# Patient Record
Sex: Female | Born: 1935 | Race: White | Hispanic: No | Smoking: Never smoker
Health system: Southern US, Community
[De-identification: ages and names within clinical notes are randomized; demographics above are authoritative.]

## PROBLEM LIST (undated history)

## (undated) DIAGNOSIS — I878 Other specified disorders of veins: Secondary | ICD-10-CM

## (undated) DIAGNOSIS — I5022 Chronic systolic (congestive) heart failure: Secondary | ICD-10-CM

## (undated) DIAGNOSIS — F0390 Unspecified dementia without behavioral disturbance: Secondary | ICD-10-CM

## (undated) DIAGNOSIS — I82409 Acute embolism and thrombosis of unspecified deep veins of unspecified lower extremity: Secondary | ICD-10-CM

## (undated) DIAGNOSIS — J449 Chronic obstructive pulmonary disease, unspecified: Secondary | ICD-10-CM

## (undated) DIAGNOSIS — I2510 Atherosclerotic heart disease of native coronary artery without angina pectoris: Secondary | ICD-10-CM

## (undated) DIAGNOSIS — C189 Malignant neoplasm of colon, unspecified: Secondary | ICD-10-CM

## (undated) HISTORY — PX: COLON SURGERY: SHX602

## (undated) HISTORY — PX: TONSILLECTOMY AND ADENOIDECTOMY: SUR1326

---

## 1997-11-23 ENCOUNTER — Encounter: Admission: RE | Admit: 1997-11-23 | Discharge: 1998-02-21 | Payer: Self-pay | Admitting: Radiation Oncology

## 2000-05-25 ENCOUNTER — Encounter (HOSPITAL_COMMUNITY): Admission: RE | Admit: 2000-05-25 | Discharge: 2000-06-24 | Payer: Self-pay | Admitting: Oncology

## 2000-05-25 ENCOUNTER — Encounter: Admission: RE | Admit: 2000-05-25 | Discharge: 2000-05-25 | Payer: Self-pay | Admitting: Oncology

## 2001-01-04 ENCOUNTER — Encounter: Admission: RE | Admit: 2001-01-04 | Discharge: 2001-01-04 | Payer: Self-pay | Admitting: Oncology

## 2001-01-04 ENCOUNTER — Encounter (HOSPITAL_COMMUNITY): Admission: RE | Admit: 2001-01-04 | Discharge: 2001-02-03 | Payer: Self-pay | Admitting: Oncology

## 2001-11-07 ENCOUNTER — Encounter: Admission: RE | Admit: 2001-11-07 | Discharge: 2001-11-07 | Payer: Self-pay | Admitting: Oncology

## 2001-11-07 ENCOUNTER — Encounter (HOSPITAL_COMMUNITY): Admission: RE | Admit: 2001-11-07 | Discharge: 2001-12-07 | Payer: Self-pay | Admitting: Oncology

## 2002-10-24 ENCOUNTER — Encounter (HOSPITAL_COMMUNITY): Admission: RE | Admit: 2002-10-24 | Discharge: 2002-11-09 | Payer: Self-pay | Admitting: Oncology

## 2002-10-24 ENCOUNTER — Encounter: Admission: RE | Admit: 2002-10-24 | Discharge: 2002-10-24 | Payer: Self-pay | Admitting: Oncology

## 2005-01-23 ENCOUNTER — Encounter (HOSPITAL_BASED_OUTPATIENT_CLINIC_OR_DEPARTMENT_OTHER): Admission: RE | Admit: 2005-01-23 | Discharge: 2005-04-23 | Payer: Self-pay | Admitting: Surgery

## 2009-09-25 ENCOUNTER — Ambulatory Visit: Payer: Self-pay | Admitting: Cardiology

## 2012-09-27 ENCOUNTER — Encounter (HOSPITAL_COMMUNITY)
Admission: EM | Disposition: A | Discharge status: Skilled Nursing Facility | Payer: Self-pay | Source: Other Acute Inpatient Hospital | Attending: Cardiology

## 2012-09-27 ENCOUNTER — Inpatient Hospital Stay (HOSPITAL_COMMUNITY)
Admission: EM | Admit: 2012-09-27 | Discharge: 2012-10-04 | DRG: 248 | Disposition: A | Discharge status: Skilled Nursing Facility | Payer: Medicare HMO | Source: Other Acute Inpatient Hospital | Attending: Cardiology | Admitting: Cardiology

## 2012-09-27 ENCOUNTER — Encounter (HOSPITAL_COMMUNITY): Payer: Self-pay | Admitting: Physician Assistant

## 2012-09-27 DIAGNOSIS — Z9981 Dependence on supplemental oxygen: Secondary | ICD-10-CM

## 2012-09-27 DIAGNOSIS — I872 Venous insufficiency (chronic) (peripheral): Secondary | ICD-10-CM | POA: Diagnosis present

## 2012-09-27 DIAGNOSIS — Z7901 Long term (current) use of anticoagulants: Secondary | ICD-10-CM

## 2012-09-27 DIAGNOSIS — F0390 Unspecified dementia without behavioral disturbance: Secondary | ICD-10-CM | POA: Diagnosis present

## 2012-09-27 DIAGNOSIS — Z86718 Personal history of other venous thrombosis and embolism: Secondary | ICD-10-CM

## 2012-09-27 DIAGNOSIS — Z66 Do not resuscitate: Secondary | ICD-10-CM | POA: Diagnosis present

## 2012-09-27 DIAGNOSIS — I509 Heart failure, unspecified: Secondary | ICD-10-CM

## 2012-09-27 DIAGNOSIS — I129 Hypertensive chronic kidney disease with stage 1 through stage 4 chronic kidney disease, or unspecified chronic kidney disease: Secondary | ICD-10-CM | POA: Diagnosis present

## 2012-09-27 DIAGNOSIS — L97919 Non-pressure chronic ulcer of unspecified part of right lower leg with unspecified severity: Secondary | ICD-10-CM

## 2012-09-27 DIAGNOSIS — L97909 Non-pressure chronic ulcer of unspecified part of unspecified lower leg with unspecified severity: Secondary | ICD-10-CM | POA: Diagnosis present

## 2012-09-27 DIAGNOSIS — I255 Ischemic cardiomyopathy: Secondary | ICD-10-CM

## 2012-09-27 DIAGNOSIS — I5021 Acute systolic (congestive) heart failure: Secondary | ICD-10-CM | POA: Diagnosis present

## 2012-09-27 DIAGNOSIS — R0602 Shortness of breath: Secondary | ICD-10-CM

## 2012-09-27 DIAGNOSIS — J4489 Other specified chronic obstructive pulmonary disease: Secondary | ICD-10-CM | POA: Diagnosis present

## 2012-09-27 DIAGNOSIS — I2109 ST elevation (STEMI) myocardial infarction involving other coronary artery of anterior wall: Principal | ICD-10-CM | POA: Diagnosis present

## 2012-09-27 DIAGNOSIS — I2582 Chronic total occlusion of coronary artery: Secondary | ICD-10-CM | POA: Diagnosis present

## 2012-09-27 DIAGNOSIS — I2589 Other forms of chronic ischemic heart disease: Secondary | ICD-10-CM | POA: Diagnosis present

## 2012-09-27 DIAGNOSIS — J449 Chronic obstructive pulmonary disease, unspecified: Secondary | ICD-10-CM | POA: Diagnosis present

## 2012-09-27 DIAGNOSIS — N183 Chronic kidney disease, stage 3 unspecified: Secondary | ICD-10-CM | POA: Diagnosis present

## 2012-09-27 DIAGNOSIS — I2510 Atherosclerotic heart disease of native coronary artery without angina pectoris: Secondary | ICD-10-CM

## 2012-09-27 DIAGNOSIS — Z85038 Personal history of other malignant neoplasm of large intestine: Secondary | ICD-10-CM

## 2012-09-27 HISTORY — DX: Malignant neoplasm of colon, unspecified: C18.9

## 2012-09-27 HISTORY — DX: Acute embolism and thrombosis of unspecified deep veins of unspecified lower extremity: I82.409

## 2012-09-27 HISTORY — DX: Chronic obstructive pulmonary disease, unspecified: J44.9

## 2012-09-27 HISTORY — DX: Chronic systolic (congestive) heart failure: I50.22

## 2012-09-27 HISTORY — PX: PERCUTANEOUS CORONARY STENT INTERVENTION (PCI-S): SHX5485

## 2012-09-27 HISTORY — DX: Unspecified dementia, unspecified severity, without behavioral disturbance, psychotic disturbance, mood disturbance, and anxiety: F03.90

## 2012-09-27 HISTORY — PX: LEFT HEART CATHETERIZATION WITH CORONARY ANGIOGRAM: SHX5451

## 2012-09-27 HISTORY — DX: Other specified disorders of veins: I87.8

## 2012-09-27 HISTORY — DX: Atherosclerotic heart disease of native coronary artery without angina pectoris: I25.10

## 2012-09-27 LAB — CBC
MCH: 30.4 pg (ref 26.0–34.0)
MCHC: 33.2 g/dL (ref 30.0–36.0)
Platelets: 165 10*3/uL (ref 150–400)
RDW: 13.7 % (ref 11.5–15.5)

## 2012-09-27 LAB — CREATININE, SERUM: Creatinine, Ser: 0.95 mg/dL (ref 0.50–1.10)

## 2012-09-27 SURGERY — LEFT HEART CATHETERIZATION WITH CORONARY ANGIOGRAM
Anesthesia: LOCAL

## 2012-09-27 MED ORDER — ACETAMINOPHEN 325 MG PO TABS
650.0000 mg | ORAL_TABLET | ORAL | Status: DC | PRN
  Administered 2012-09-27: 650 mg via ORAL
  Filled 2012-09-27: qty 2

## 2012-09-27 MED ORDER — ONDANSETRON HCL 4 MG/2ML IJ SOLN: 4.0000 mg | Freq: Four times a day (QID) | INTRAMUSCULAR | Status: DC | PRN

## 2012-09-27 MED ORDER — ISOSORBIDE MONONITRATE ER 30 MG PO TB24
30.0000 mg | ORAL_TABLET | Freq: Every day | ORAL | Status: DC
  Administered 2012-09-27: 30 mg via ORAL
  Filled 2012-09-27 (×2): qty 1

## 2012-09-27 MED ORDER — ASPIRIN 81 MG PO CHEW
81.0000 mg | CHEWABLE_TABLET | Freq: Every day | ORAL | Status: DC
  Administered 2012-09-28 – 2012-10-04 (×7): 81 mg via ORAL
  Filled 2012-09-27 (×6): qty 1

## 2012-09-27 MED ORDER — HEPARIN (PORCINE) IN NACL 2-0.9 UNIT/ML-% IJ SOLN
INTRAMUSCULAR | Status: AC
  Filled 2012-09-27: qty 1000

## 2012-09-27 MED ORDER — LIDOCAINE HCL (PF) 1 % IJ SOLN
INTRAMUSCULAR | Status: AC
  Filled 2012-09-27: qty 30

## 2012-09-27 MED ORDER — ATORVASTATIN CALCIUM 10 MG PO TABS
10.0000 mg | ORAL_TABLET | Freq: Every day | ORAL | Status: DC
  Administered 2012-09-27 – 2012-10-03 (×7): 10 mg via ORAL
  Filled 2012-09-27 (×8): qty 1

## 2012-09-27 MED ORDER — METOPROLOL TARTRATE 25 MG PO TABS
25.0000 mg | ORAL_TABLET | Freq: Two times a day (BID) | ORAL | Status: DC
  Administered 2012-09-27: 25 mg via ORAL
  Filled 2012-09-27 (×3): qty 1

## 2012-09-27 MED ORDER — VERAPAMIL HCL 2.5 MG/ML IV SOLN
INTRAVENOUS | Status: AC
  Filled 2012-09-27: qty 2

## 2012-09-27 MED ORDER — NITROGLYCERIN 0.2 MG/ML ON CALL CATH LAB
INTRAVENOUS | Status: AC
  Filled 2012-09-27: qty 1

## 2012-09-27 MED ORDER — HYDRALAZINE HCL 20 MG/ML IJ SOLN
10.0000 mg | Freq: Once | INTRAMUSCULAR | Status: AC
  Administered 2012-09-27: 10 mg via INTRAVENOUS
  Filled 2012-09-27: qty 1

## 2012-09-27 MED ORDER — CLOPIDOGREL BISULFATE 300 MG PO TABS
ORAL_TABLET | ORAL | Status: AC
  Filled 2012-09-27: qty 2

## 2012-09-27 MED ORDER — SODIUM CHLORIDE 0.9 % IV SOLN
INTRAVENOUS | Status: AC
  Administered 2012-09-27: 19:00:00 via INTRAVENOUS

## 2012-09-27 MED ORDER — ALBUTEROL SULFATE (5 MG/ML) 0.5% IN NEBU
2.5000 mg | INHALATION_SOLUTION | Freq: Four times a day (QID) | RESPIRATORY_TRACT | Status: DC | PRN
  Administered 2012-09-30 – 2012-10-03 (×3): 2.5 mg via RESPIRATORY_TRACT
  Filled 2012-09-27 (×3): qty 0.5

## 2012-09-27 MED ORDER — CLOPIDOGREL BISULFATE 75 MG PO TABS
75.0000 mg | ORAL_TABLET | Freq: Every day | ORAL | Status: DC
  Administered 2012-09-28 – 2012-10-04 (×7): 75 mg via ORAL
  Filled 2012-09-27 (×6): qty 1

## 2012-09-27 MED ORDER — FENTANYL CITRATE 0.05 MG/ML IJ SOLN
INTRAMUSCULAR | Status: AC
  Filled 2012-09-27: qty 2

## 2012-09-27 MED ORDER — ACETAMINOPHEN 325 MG PO TABS: 650.0000 mg | ORAL_TABLET | ORAL | Status: DC | PRN

## 2012-09-27 MED ORDER — HEPARIN SODIUM (PORCINE) 5000 UNIT/ML IJ SOLN
5000.0000 [IU] | Freq: Three times a day (TID) | INTRAMUSCULAR | Status: DC
  Filled 2012-09-27 (×4): qty 1

## 2012-09-27 MED ORDER — HEPARIN SODIUM (PORCINE) 1000 UNIT/ML IJ SOLN
INTRAMUSCULAR | Status: AC
  Filled 2012-09-27: qty 1

## 2012-09-27 NOTE — CV Procedure (Signed)
Cardiac Catheterization Procedure Note  Name: Kara Rice MRN: [id number] DOB: 1918-01-19  Procedure: Left heart catheterization, Selective Coronary Angiography,  PTCA and stenting of the first diagonal. Attempted PTCA of the LAD.  Indication: 79 year old white female with history of congestive heart failure, prior PE, COPD, presented to [facility] in [place] with complaints of dyspnea. No chest pain. ECG showed ST elevation 2 mm in the anterolateral leads that was new compared to April 2014. She was transferred from an outside hospital for emergent cardiac catheterization. She has been on chronic Coumadin therapy and INR is therapeutic.  Procedural Details:  The right wrist was prepped, draped, and anesthetized with 1% lidocaine. Using the modified Seldinger technique, a 6 French sheath was introduced into the right radial artery. 3 mg of verapamil was administered through the sheath, weight-based unfractionated heparin was administered intravenously. Standard Judkins catheters were used for selective coronary angiography. Catheter exchanges were performed over an exchange length guidewire.  PROCEDURAL FINDINGS Hemodynamics: AO 170/92 with a mean of 125 mmHg LV 173/22 mmHg   Coronary angiography: Coronary dominance: right  Left mainstem: Normal.  Left anterior descending (LAD): There is a 90-95% stenosis in the proximal LAD extending into a very large first diagonal branch. The continuation of the LAD after the diagonal is occluded. There were very mild left to left collaterals to the LAD. There were right to left collaterals to the septal perforators.  Left circumflex (LCx): The left circumflex gives rise to 3 marginal branches. There is mild nonobstructive disease in the mid vessel up to 20%. The distal marginal branch has segmental disease up to 30%.  Right coronary artery (RCA): The right coronary has an anterior takeoff. It was engaged with a left Amplatz catheter. It is a  dominant vessel and is normal.  Left ventriculography: Not performed  PCI Note:  Following the diagnostic procedure, the decision was made to proceed with PCI of the LAD/diagonal.  Weight-based heparin was given for anticoagulation. Plavix 600 mg was given orally. Once a therapeutic ACT was achieved, a 6 [place] XB LAD 3.5 guide catheter was inserted.  A miracle brothers 3 coronary guidewire was used to cross the lesion in the diagonal. The LAD had an acute takeoff from the diagonal and only a very small channel was seen. Extensive attempts to access the LAD with a wire were unsuccessful. At one point we were able to advance the wire tip into the very proximal portion of the LAD but we were unable to advance it further and the wire position was very tenuous. After attempting to access this for 45-60 minutes we decided to proceed with stenting of the large first diagonal branch. The lesion was predilated with a 2.5 mm balloon.  The lesion was then stented with a 3.0 x 24 mm Rebel stent.   Following PCI, there was 0% residual stenosis and TIMI-3 flow in the diagonal. The LAD remained occluded. Final angiography confirmed an excellent result. The patient tolerated the procedure well. There were no immediate procedural complications. A TR band was used for radial hemostasis. The patient was transferred to the post catheterization recovery area for further monitoring.  PCI Data: Vessel - first diagonal/Segment - proximal Percent Stenosis (pre)  90-95% TIMI-flow 3 Stent 3.0 x 24 mm rebel Percent Stenosis (post) 0% TIMI-flow (post) 3  Vessel are 2 mid LAD-unsuccessful PCI due to inability to cross with a wire.  Final Conclusions:   1. Single vessel occlusive coronary disease. 2. Successful stenting of the  first diagonal with a bare-metal stent. Unsuccessful PCI of the LAD.   Recommendations:  Dual antiplatelet therapy with aspirin and Plavix for at least one month and preferably for one year. We will  assess LV function with an echocardiogram.  Peter [place] MD,FACC  09/27/2012, 5:23 PM

## 2012-09-27 NOTE — Progress Notes (Signed)
PHARMACIST - PHYSICIAN COMMUNICATION DR:  [place] CONCERNING: Pharmacy Care Issues Regarding Warfarin Labs  Per home med info from daughter, Maudry Zeidan, patient was taking Coumadin 5 mg alternating with 4 mg every other day.  Hx PE/DVT about 3 years ago. Last Coumadin dose was 4 mg on 09/26/12.  INR today at [facility] was 2.3. Coumadin is now discontinued post-PCI. To continue Aspirin 81 mg and Plavix 75 mg daily. Heparin 5000 units SQ q8hr to begin on 8/20 for VTE prophylaxis.  Plan:  - added PT/INR for am.  - hold SQ heparin while INR >2.  Nicolette Bang, RPh Pager: [phone] 09/27/2012 11:28 PM

## 2012-09-27 NOTE — H&P (Signed)
History and Physical  Patient ID: Kara Rice MRN: [id number], DOB: [birth year] Date of Encounter: 09/27/2012, 3:56 PM Primary Physician: No primary provider on file. Primary Cardiologist: New to [facility], unclear if she has been followed previously elsewhere  Chief Complaint: SOB Reason for Admission: STEMI  HPI: Kara Rice is a [name] with history (obtained solely from her daughter) of dementia, DVT (on chronic Coumadin), venous stasis with leg ulcers, COPD on home O2, remote colon cancer, CHF who presented to [facility] today upon emergent transfer from [facility]. The daughter took the patient to her wound care appointment today and she apparently became very SOB and wheezy. EMS was called but the patient refused for them to touch her and refused ambulance ride at that time. EMS urged the patient to go to the ER, so she agreed to go via her family's car. In the ER, venous duplex neg for DVT. It appears that initial EKG showed NSR with nonspecific ST changes. Troponin returned positive at 1.89. F/u EKG showed anterolateral ST elevation thus code STEMI was called. The patient received 324mg  ASA, initiation of heparin, and was transferred emergently to the cath lab at [facility]. Per her daughter, the patient has not complained of CP whatsoever. The patient was unable to provide any relevant history due to her dementia. She knew she was having a heart attack but did not recall the details leading up to this. Daughter's # Kara Rice [phone]. The pt lives with her daughter who assists with all ADLs. Her son is on his way to the hospital now.  Past Medical History  Diagnosis Date  . Dementia   . DVT (deep venous thrombosis)     on Coumadin  . Venous stasis     Hx leg ulcers  . COPD (chronic obstructive pulmonary disease)     On home oxygen  . Colon cancer     Age 31 s/p surgery.  . CHF (congestive heart failure)     Per daughter's report    Dtr declines hx of  TIA/CVA, DM, HTN, HL.  Most Recent Cardiac Studies: None   Surgical History: No past surgical history on file.   Home Meds: Do not currently have access to this. Pt doesn't know. Will need to have pharmacy reconcile meds. [place] list states: Coumadin, Imdur, ventolin nebs, Vicodin  Allergies:  Allergies  Allergen Reactions  . Codeine     Hx of nausea with pain meds with this    History   Social History  . Marital Status: Widowed    Spouse Name: N/A    Number of Children: N/A  . Years of Education: N/A   Occupational History  . Not on file.   Social History Main Topics  . Smoking status: Never Smoker   . Smokeless tobacco: Not on file  . Alcohol Use: No  . Drug Use: No  . Sexual Activity: Not on file   Other Topics Concern  . Not on file   Social History Narrative  . No narrative on file    Family Hx: unable to obtain as patient is demented  Review of Systems: unable to obtain as patient is demented  Labs: Na 140, K 4.2, cl 107, CO2 28, glu 96, BUN 18, Cr 1.2, ca 9.1, tbili 0.3, ast 22, alt 12, AP 66, total prot 7.2, albumin 3.8 WBC 4.9, Hgb 12, Hc 35.4, Plt 196 pBNP 758 INR 2.2  CK 111 MB 4.4 Troponin 1.89  Radiology/Studies:  8/19 RLE Venous duplex - no evidence of R leg DVT. 8/19 CXR - minimal L effusio or pleural scarring, otherwise no acute abnormality, cardiac enlargement   EKG: NSR 70bpm inferior infarct old, probable acute anterolateral infarct with ST elevation V3-V5  Physical Exam: HR 90, BP 173/99, RR 14, POx 99% o2 General: Well developed elderly WF in no acute distress. Head: Normocephalic, atraumatic, sclera non-icteric, no xanthomas, nares are without discharge.  Neck: Supple Lungs: Clear bilaterally to auscultation anteriorly without wheezes, rales, or rhonchi. Breathing is unlabored. Heart: RRR with S1 S2. No murmurs, rubs, or gallops appreciated (quick assessment given acuity of situation) Abdomen: Soft, non-tender, non-distended  with normoactive bowel sounds. No hepatomegaly. No rebound/guarding. No obvious abdominal masses. Msk:  Strength and tone appear normal for age. Extremities: No clubbing or cyanosis. Tr pedal edema, R leg wrapped. Mild bilateral shin erythema bilaterally.  Distal pedal pulses are 2+ and equal bilaterally. Neuro: Alert and oriented to place, self. No focal deficit. Follows commands. Psych:  Normal affect.    ASSESSMENT AND PLAN:  1. Acute STEMI/CAD 2. HTN 3. Prior history of DVT, on chronic Coumadin 4. CHF, type unknown 5. Mild acute renal insufficiency, baseline Cr unknown 6. COPD, on home O2 7. Venous stasis with history of ulceration 8. Dementia 9. Remote history of colon cancer  Will admit to CCU, cycle enzymes, check lipids. Ask pharmacy to clarify home med list. PRN nebs. Add daily ASA and statin. Check 2D echo. Wound care consult for wrapped R leg wound. Follow renal function. MD to decide regarding continuation of Coumadin and choice of antiplatelet agent.  Signed, Ronie Spies PA-C 09/27/2012, 3:56 PM  Patient seen and examined and history reviewed. Agree with above findings and plan. Little history available on initial evaluation. Patient presented with symptoms of dyspnea without chest pain. Ecg subsequently demonstrated ST elevation 2 mm in the anterolateral leads new since 4/14. Emergent cardiac cath recommended.   Theron Arista [facility] 09/27/2012 5:57 PM

## 2012-09-27 NOTE — Progress Notes (Signed)
Called daughter to give update at her request. Also clarified - pt has only one episode of PE/DVT about 3 yrs ago. No recent recurrence. Dayna Dunn PA-C

## 2012-09-27 NOTE — Progress Notes (Signed)
CRITICAL VALUE ALERT  Critical value received:  Torponin 1.26  Date of notification:  09/27/12  Time of notification:  2126  Critical value read back:yes  Nurse who received alert:  Exie Parody RN  MD notified (1st page): Dr. Shirlee Latch [facility])  Time of first page:  2130  Responding MD: Dr. Shirlee Latch  Time MD responded:  2133

## 2012-09-28 DIAGNOSIS — J449 Chronic obstructive pulmonary disease, unspecified: Secondary | ICD-10-CM | POA: Diagnosis present

## 2012-09-28 DIAGNOSIS — L97909 Non-pressure chronic ulcer of unspecified part of unspecified lower leg with unspecified severity: Secondary | ICD-10-CM | POA: Diagnosis present

## 2012-09-28 DIAGNOSIS — I369 Nonrheumatic tricuspid valve disorder, unspecified: Secondary | ICD-10-CM

## 2012-09-28 LAB — LIPID PANEL
LDL Cholesterol: 84 mg/dL (ref 0–99)
Triglycerides: 78 mg/dL (ref <150)
VLDL: 16 mg/dL (ref 0–40)

## 2012-09-28 LAB — CBC
HCT: 27.3 % — ABNORMAL LOW (ref 36.0–46.0)
MCHC: 33.7 g/dL (ref 30.0–36.0)
MCV: 90.7 fL (ref 78.0–100.0)
MCV: 90.4 fL (ref 78.0–100.0)
Platelets: 144 10*3/uL — ABNORMAL LOW (ref 150–400)
Platelets: 154 10*3/uL (ref 150–400)
RDW: 13.8 % (ref 11.5–15.5)
RDW: 13.8 % (ref 11.5–15.5)
WBC: 5 10*3/uL (ref 4.0–10.5)

## 2012-09-28 LAB — PROTIME-INR: INR: 2.43 — ABNORMAL HIGH (ref 0.00–1.49)

## 2012-09-28 LAB — PRO B NATRIURETIC PEPTIDE: Pro B Natriuretic peptide (BNP): 6323 pg/mL — ABNORMAL HIGH (ref 0–450)

## 2012-09-28 LAB — COMPREHENSIVE METABOLIC PANEL
Alkaline Phosphatase: 52 U/L (ref 39–117)
BUN: 16 mg/dL (ref 6–23)
CO2: 26 mEq/L (ref 19–32)
Chloride: 106 mEq/L (ref 96–112)
GFR calc Af Amer: 52 mL/min — ABNORMAL LOW (ref >90)
GFR calc non Af Amer: 45 mL/min — ABNORMAL LOW (ref >90)
Glucose, Bld: 92 mg/dL (ref 70–99)
Potassium: 4.1 mEq/L (ref 3.5–5.1)
Total Bilirubin: 0.4 mg/dL (ref 0.3–1.2)

## 2012-09-28 MED ORDER — LISINOPRIL 5 MG PO TABS
5.0000 mg | ORAL_TABLET | Freq: Every day | ORAL | Status: DC
  Administered 2012-09-29 – 2012-10-04 (×6): 5 mg via ORAL
  Filled 2012-09-28 (×7): qty 1

## 2012-09-28 MED ORDER — CARVEDILOL 6.25 MG PO TABS
6.2500 mg | ORAL_TABLET | Freq: Two times a day (BID) | ORAL | Status: DC
  Administered 2012-09-28 – 2012-10-04 (×13): 6.25 mg via ORAL
  Filled 2012-09-28 (×15): qty 1

## 2012-09-28 MED ORDER — HEPARIN SODIUM (PORCINE) 5000 UNIT/ML IJ SOLN
5000.0000 [IU] | Freq: Three times a day (TID) | INTRAMUSCULAR | Status: DC
  Administered 2012-09-29 – 2012-10-03 (×14): 5000 [IU] via SUBCUTANEOUS
  Filled 2012-09-28 (×20): qty 1

## 2012-09-28 NOTE — Progress Notes (Signed)
  Echocardiogram 2D Echocardiogram has been performed.  Kara Rice 09/28/2012, 12:13 PM

## 2012-09-28 NOTE — Progress Notes (Signed)
Pt received 10mg  of hydralazine @ 2021. At 2231 pt received metoprolol 25mg  BP was 114/55. Since receiving medication pts BP has dropped to SBP between 70-80 and HR 45-60. Pt is sleeping. Assessed pt and shows no signs of mental status changes. Will continue to assess and monitor.

## 2012-09-28 NOTE — Care Management Note (Signed)
    Page 1 of 1   09/28/2012     7:58:21 AM   CARE MANAGEMENT NOTE 09/28/2012  Patient:  Kara Rice, Kara Rice   Account Number:  [id number]  Date Initiated:  09/28/2012  Documentation initiated by:  Junius Creamer  Subjective/Objective Assessment:   adm w mi     Action/Plan:   lives w da   Anticipated DC Date:     Anticipated DC Plan:        DC Planning Services  CM consult      Choice offered to / List presented to:             Status of service:   Medicare Important Message given?   (If response is "NO", the following Medicare IM given date fields will be blank) Date Medicare IM given:   Date Additional Medicare IM given:    Discharge Disposition:    Per UR Regulation:  Reviewed for med. necessity/level of care/duration of stay  If discussed at Long Length of Stay Meetings, dates discussed:    Comments:

## 2012-09-28 NOTE — Progress Notes (Signed)
CARDIAC REHAB PHASE I   PRE:  Rate/Rhythm: 66SR  BP:  Supine:  Sitting: 122/50  Standing:    SaO2: 99%RA  MODE:  Ambulation: 220 ft   POST:  Rate/Rhythm: 99SR  BP:  Supine:   Sitting: 129/71  Standing:    SaO2: 97%RA 1325-1355 Pt walked 220 ft with gait belt use, rolling walker and asst x 2 with fairly steady gait. Denied CP. Wheezing some during walk. Pt stated that sometimes her left leg gives out and she just sits down at home. Uses one crutch on that side to walk at home. Pt asks same questions over again as if she does not remember talking about them already. To recliner with family present.  Luetta Nutting, RN BSN  09/28/2012 1:50 PM

## 2012-09-28 NOTE — Progress Notes (Addendum)
TELEMETRY: Reviewed telemetry pt in NSR: Filed Vitals:   09/28/12 0400 09/28/12 0447 09/28/12 0500 09/28/12 0600  BP: 103/54  117/104 92/40  Pulse: 60   93  Temp: 97.7 F (36.5 C)     TempSrc: Oral     Resp: 22  16 21   Height:      Weight:  149 lb 0.5 oz (67.6 kg)    SpO2: 100%  100% 100%    Intake/Output Summary (Last 24 hours) at 09/28/12 0707 Last data filed at 09/28/12 0500  Gross per 24 hour  Intake    675 ml  Output   1600 ml  Net   -925 ml    SUBJECTIVE Feels well. No SOB or chest pain. Comfortable.   LABS: Basic Metabolic Panel:  Recent Labs  54/09/81 2009 09/28/12 0220  NA  --  139  K  --  4.1  CL  --  106  CO2  --  26  GLUCOSE  --  92  BUN  --  16  CREATININE 0.95 1.05  CALCIUM  --  8.3*   Liver Function Tests:  Recent Labs  09/28/12 0220  AST 20  ALT 8  ALKPHOS 52  BILITOT 0.4  PROT 5.3*  ALBUMIN 2.6*   CBC:  Recent Labs  09/27/12 2009 09/28/12 0220  WBC 5.0 4.2  HGB 10.9* 9.2*  HCT 32.8* 27.3*  MCV 91.6 90.7  PLT 165 144*   Cardiac Enzymes:  Recent Labs  09/27/12 2009 09/28/12 0220  TROPONINI 1.26* 2.69*   Fasting Lipid Panel:  Recent Labs  09/28/12 0220  CHOL 134  HDL 34*  LDLCALC 84  TRIG 78  CHOLHDL 3.9   Thyroid Function Tests:  Recent Labs  09/27/12 2009  TSH 2.606     Radiology/Studies:  CXR at [facility]-minimal left effusion/scarring. Cardiac enlargement.  Ecg: NSR, LAD, anteroseptal infarct.   PHYSICAL EXAM General: very pleasant, well nourished, in no acute distress. Head: Normal Neck: Negative for carotid bruits. JVD not elevated. Lungs: Few bilateral wheezes. Breathing is unlabored. Heart: RRR S1 S2 without murmurs, rubs, or gallops.  Abdomen: Soft, non-tender, non-distended with normoactive bowel sounds. No hepatomegaly. No rebound/guarding. No obvious abdominal masses. Msk:  Strength and tone appears normal for age. Extremities: No clubbing, cyanosis or edema.  Distal pedal  pulses are 2+ and equal bilaterally. No radial hematoma. Neuro: Alert and oriented X 3. Moves all extremities spontaneously. No focal findings. Psych:  Responds to questions appropriately with a normal affect.  ASSESSMENT AND PLAN: 1. Anterior STEMI. S/p BMS of large first diagonal. Unsuccessful attempt to open LAD. Based on fairly small enzyme rise I suspect LAD occlusion may be chronic and current event was related to diagonal occlusion. Will continue ASA and Plavix. Continue beta blocker and statin. Hold nitrates given low BP today. Will check echo. 2. History of DVT/PE 3 years ago. On coumadin previously. Will hold coumadin now that she is on ASA and plavix. LE venous doppler yesterday was negative for DVT. Only indication for resuming coumadin would be if she has LV thrombus by Echo.  3. COPD. 4. HTN will treat with coreg and ACEi. 5. Advance directive. Patient states she does not have a living will. She states that if she stops breathing or if her heart stops she desires [place] to "let her go". Doesn't want aggressive end of life care at her age. Will make her a DNR. 6. Leg ulcer. Wound care consult.  Active Problems:  ST elevation myocardial infarction (STEMI) of anterolateral wall   CHF (congestive heart failure)   CKD (chronic kidney disease) stage 3, GFR 30-59 ml/min    Signed, Walida Cajas [place] MD,FACC 09/28/2012 7:17 AM

## 2012-09-28 NOTE — Consult Note (Signed)
WOC consult Note Reason for Consult: evaluation of right LE wounds. Bedside nursing reports pt living with daughter and has been treated at the [facility] wound care center.  Pt does tell me that she has LE edema and sometimes the really are swollen. She reports a few weeks ago two blisters appeared on the RLE "about the size of a quarter".  She is not able to tell me any treatment received at the wound care center, except that she has seen two different doctors there. No family in the room currently. Wound type: ruptured blisters RLE lateral secondary to edema, however she does not have significant edema today at the time of my assessment. Measurement: two areas right calf laterally, 2.0cm x 1.5cmx 0.2cm  distal and 1.5cm x 1.0cm x 0.2cm proximal  Wound bed: both are clean, pink and moist, with evidence of bulla rupture at the wound edges. Drainage (amount, consistency, odor) minimal serosanguinous  Periwound: intact, some mild erythema at the distal site. Noted some ecchymosis just below the patella, unclear on the etiology. Dressing procedure/placement/frequency: xeroform gauze ordered for topical tx. For antibacterial properties and to manage minimal exudate.  Wrap with kerlix and change daily.  Follow up as needed in the wound care center, would suggest light compression knee highs (10-43mmHG) to manage edema and prevent further skin breakdown.    Discussed POC with patient and bedside nurse.  Re consult if needed, will not follow at this time. Thanks  Bethzy Hauck [employer], CWOCN [phone])

## 2012-09-29 DIAGNOSIS — J449 Chronic obstructive pulmonary disease, unspecified: Secondary | ICD-10-CM

## 2012-09-29 LAB — BASIC METABOLIC PANEL
Chloride: 106 mEq/L (ref 96–112)
Creatinine, Ser: 1.32 mg/dL — ABNORMAL HIGH (ref 0.50–1.10)
GFR calc Af Amer: 39 mL/min — ABNORMAL LOW (ref >90)

## 2012-09-29 MED ORDER — ALUM & MAG HYDROXIDE-SIMETH 200-200-20 MG/5ML PO SUSP
30.0000 mL | Freq: Four times a day (QID) | ORAL | Status: DC | PRN
  Administered 2012-09-29: 30 mL via ORAL
  Filled 2012-09-29: qty 30

## 2012-09-29 NOTE — Progress Notes (Signed)
TELEMETRY: Reviewed telemetry pt in NSR: Filed Vitals:   09/29/12 0400 09/29/12 0500 09/29/12 0600 09/29/12 0700  BP: 98/49  108/44 139/32  Pulse:      Temp: 97.7 F (36.5 C)     TempSrc: Oral     Resp: 26  32 30  Height:      Weight:  154 lb 12.2 oz (70.2 kg)    SpO2: 95%       Intake/Output Summary (Last 24 hours) at 09/29/12 0729 Last data filed at 09/29/12 0500  Gross per 24 hour  Intake    325 ml  Output    950 ml  Net   -625 ml    SUBJECTIVE Feels well. No SOB or chest pain. Comfortable.   LABS: Basic Metabolic Panel:  Recent Labs  16/10/96 0220 09/29/12 0500  NA 139 138  K 4.1 4.7  CL 106 106  CO2 26 25  GLUCOSE 92 102*  BUN 16 20  CREATININE 1.05 1.32*  CALCIUM 8.3* 9.2   Liver Function Tests:  Recent Labs  09/28/12 0220  AST 20  ALT 8  ALKPHOS 52  BILITOT 0.4  PROT 5.3*  ALBUMIN 2.6*   CBC:  Recent Labs  09/28/12 0220 09/28/12 0730  WBC 4.2 5.0  HGB 9.2* 9.5*  HCT 27.3* 28.2*  MCV 90.7 90.4  PLT 144* 154   Cardiac Enzymes:  Recent Labs  09/27/12 2009 09/28/12 0220 09/28/12 0730  TROPONINI 1.26* 2.69* 2.45*   Fasting Lipid Panel:  Recent Labs  09/28/12 0220  CHOL 134  HDL 34*  LDLCALC 84  TRIG 78  CHOLHDL 3.9   Thyroid Function Tests:  Recent Labs  09/27/12 2009  TSH 2.606     Radiology/Studies:  CXR at [facility] hospital-minimal left effusion/scarring. Cardiac enlargement.  Ecg: NSR, LAD, anteroseptal infarct.   Echo:Study Conclusions  - Left ventricle: Septal apical mid and distal anterior wall hypokinesis as well as apical inferior wall hypokinesos The cavity size was moderately dilated. Wall thickness was normal. The estimated ejection fraction was 30%. - Atrial septum: No defect or patent foramen ovale was identified.   PHYSICAL EXAM General: very pleasant, well nourished, in no acute distress. Head: Normal Neck: Negative for carotid bruits. JVD not elevated. Lungs: Clear. Breathing is  unlabored. Heart: RRR S1 S2 without murmurs, rubs, or gallops.  Abdomen: Soft, non-tender, non-distended with normoactive bowel sounds. No hepatomegaly. No rebound/guarding. No obvious abdominal masses. Msk:  Strength and tone appears normal for age. Extremities: No clubbing, cyanosis or edema.  Distal pedal pulses are 2+ and equal bilaterally. No radial hematoma. Neuro: Alert and oriented X 3. Moves all extremities spontaneously. No focal findings. Psych:  Responds to questions appropriately with a normal affect.  ASSESSMENT AND PLAN: 1. Anterior STEMI. S/p BMS of large first diagonal. Unsuccessful attempt to open LAD. Based on fairly small enzyme rise I suspect LAD occlusion was chronic and current event was related to diagonal occlusion. Will continue ASA and Plavix. Continue beta blocker and statin.  2. History of DVT/PE 3 years ago. On coumadin previously. Will hold coumadin now that she is on ASA and plavix. LE venous doppler yesterday was negative for DVT.  3. Ischemic cardiomyopathy with EF 30%. On ACEi and carvedilol. Will treat with coreg and ACEi. BP soft. Plan to start aldactone tomorrow if BP improved and renal function ok. 4. HTN 5. DNR 6. Leg ulcer. Wound care consult. 7. COPD  Will transfer to telemetry today.  Active Problems:  ST elevation myocardial infarction (STEMI) of anterolateral wall   CHF (congestive heart failure)   CKD (chronic kidney disease) stage 3, GFR 30-59 ml/min   COPD (chronic obstructive pulmonary disease)   Leg ulcer    Signed, Evangelia Whitaker [place] MD,FACC 09/29/2012 7:29 AM

## 2012-09-29 NOTE — Evaluation (Signed)
Physical Therapy Evaluation Patient Details Name: Kara Rice MRN: [id number] DOB: [birth year] Today's Date: 09/29/2012 Time: 5284-1324 PT Time Calculation (min): 17 min  PT Assessment / Plan / Recommendation History of Present Illness  [name] with history of dementia, DVT (on chronic Coumadin), venous stasis with leg ulcers, COPD on home O2, remote colon cancer, CHF who presented to [facility] today upon emergent transfer from [facility]. The daughter took the patient to her wound care appointment today and she apparently became very SOB and wheezy. EMS was called but the patient refused for them to touch her and refused ambulance ride at that time. EMS urged the patient to go to the ER, so she agreed to go via her family's car. In the ER, venous duplex neg for DVT. It appears that initial EKG showed NSR with nonspecific ST changes. Troponin returned positive at 1.89. F/u EKG showed anterolateral ST elevation thus code STEMI was called. The patient received 324mg  ASA, initiation of heparin, and was transferred emergently to the cath lab at [facility]. Per her daughter, the patient has not complained of CP whatsoever. The patient was unable to provide any relevant history due to her dementia. She knew she was having a heart attack but did not recall the details leading up to this. Daughter's # Donata Clay [phone].  Admitted for STEMI, s/p Left heart catheterization, Selective Coronary Angiography,  PTCA and stenting of the first diagonal. Attempted PTCA of the LAD.  Clinical Impression  Pt admitted with above. Pt currently with functional limitations due to the deficits listed below (see PT Problem List).  Pt will benefit from skilled PT to increase their independence and safety with mobility to allow discharge to the venue listed below.  Pt with memory deficits stating she lives alone and uses crutch for mobility.  Per RN, daughter can no longer provide required care.        PT Assessment  Patient needs continued PT services    Follow Up Recommendations  Supervision/Assistance - 24 hour;SNF    Does the patient have the potential to tolerate intense rehabilitation      Barriers to Discharge        Equipment Recommendations  None recommended by PT (pt states she has RW (?))    Recommendations for Other Services     Frequency Min 3X/week    Precautions / Restrictions Precautions Precautions: Fall   Pertinent Vitals/Pain C/o pain in R lower leg, RN in room and aware Maintained 3L O2 [place] (RN stated to leave on O2 for today)      Mobility  Bed Mobility Bed Mobility: Not assessed Supine to Sit: 3: Mod assist Sitting - Scoot to Edge of Bed: 3: Mod assist Transfers Transfers: Sit to Stand;Stand to Sit Sit to Stand: 4: Min assist;With upper extremity assist;From chair/3-in-1 Stand to Sit: With upper extremity assist;To chair/3-in-1;4: Min assist Details for Transfer Assistance: verbal cues for use of armrests, assist to rise and control descent Ambulation/Gait Ambulation/Gait Assistance: 4: Min guard Ambulation Distance (Feet): 100 Feet Assistive device: Rolling walker Ambulation/Gait Assistance Details: pt reporting increased R LE pain with ambulation so encouraged use of UE through RW for pain control, pt reports UE fatigue end of ambulation Gait Pattern: Step-through pattern;Trunk flexed;Decreased stance time - right    Exercises     PT Diagnosis: Difficulty walking  PT Problem List: Decreased strength;Decreased activity tolerance;Decreased mobility;Decreased safety awareness;Decreased knowledge of use of DME;Cardiopulmonary status limiting activity PT Treatment Interventions:  DME instruction;Gait training;Functional mobility training;Therapeutic activities;Therapeutic exercise;Patient/family education     PT Goals(Current goals can be found in the care plan section) Acute Rehab PT Goals PT Goal Formulation: With patient Time For  Goal Achievement: 10/13/12 Potential to Achieve Goals: Good  Visit Information  Last PT Received On: 09/29/12 Assistance Needed: +1 History of Present Illness: [name] with history of dementia, DVT (on chronic Coumadin), venous stasis with leg ulcers, COPD on home O2, remote colon cancer, CHF who presented to [facility] today upon emergent transfer from [facility]. The daughter took the patient to her wound care appointment today and she apparently became very SOB and wheezy. EMS was called but the patient refused for them to touch her and refused ambulance ride at that time. EMS urged the patient to go to the ER, so she agreed to go via her family's car. In the ER, venous duplex neg for DVT. It appears that initial EKG showed NSR with nonspecific ST changes. Troponin returned positive at 1.89. F/u EKG showed anterolateral ST elevation thus code STEMI was called. The patient received 324mg  ASA, initiation of heparin, and was transferred emergently to the cath lab at [facility]. Per her daughter, the patient has not complained of CP whatsoever. The patient was unable to provide any relevant history due to her dementia. She knew she was having a heart attack but did not recall the details leading up to this. Daughter's # Donata Clay [phone].  Admitted for STEMI, s/p Left heart catheterization, Selective Coronary Angiography,  PTCA and stenting of the first diagonal. Attempted PTCA of the LAD.       Prior Functioning  Home Living Family/patient expects to be discharged to:: Skilled nursing facility Additional Comments: From home with daughter, however pt reports she lives alone Prior Function Level of Independence: Needs assistance Gait / Transfers Assistance Needed: pt states she uses crutch at home however unreliable historian due to dementia ADL's / Homemaking Assistance Needed: mod to max Communication Communication: No difficulties    Cognition   Cognition Arousal/Alertness: Awake/alert Behavior During Therapy: WFL for tasks assessed/performed Overall Cognitive Status: No family/caregiver present to determine baseline cognitive functioning    Extremity/Trunk Assessment Upper Extremity Assessment Upper Extremity Assessment: Generalized weakness Lower Extremity Assessment Lower Extremity Assessment: Generalized weakness (R lower leg dressing covering open blister (per nsg)) Cervical / Trunk Assessment Cervical / Trunk Assessment: Kyphotic   Balance    End of Session PT - End of Session Equipment Utilized During Treatment: Gait belt;Oxygen Activity Tolerance: Patient limited by fatigue;Patient limited by pain Patient left: in chair;with call bell/phone within reach;with chair alarm set Nurse Communication: Mobility status  GP     Jaedon Siler,KATHrine E 09/29/2012, 3:36 PM Zenovia Jarred, PT, DPT 09/29/2012 Pager: [phone]

## 2012-09-29 NOTE — Progress Notes (Addendum)
Pt. Complained of intermittent chest pain in mid chest and some pain in the right side of her abdomen that also comes and go. Described the pain as sore and radiates to lt. Side and goes away. Felt nauseated also, BP stable 12 lead EKG done which compared with the morning EKG appears no changes. Dr. Adolm Joseph was notified of the EKG and pts. symptoms. Will try meds for indigestion and continue to monitor.

## 2012-09-29 NOTE — Progress Notes (Signed)
[id number] Came to walk with pt. She declined. 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 Stated legs and hips hurt. Also said she had discomfort in chest but not like CP she came in with. Just a little gnawing in chest. Offered to stand pt but again declined. Encouraged pt to work with PT later so they can evaluate her mobility. Pt able to walk with [place] yesterday. Lener Ventresca RNBSN

## 2012-09-29 NOTE — Progress Notes (Signed)
Clinical Social Work Department BRIEF PSYCHOSOCIAL ASSESSMENT 09/29/2012  Patient:  Kara Rice, Kara Rice     Account Number:  [id number]     Admit date:  09/27/2012  Clinical Social Worker:  Harless Nakayama  Date/Time:  09/29/2012 11:15 AM  Referred by:  Physician  Date Referred:  09/29/2012 Referred for  SNF Placement   Other Referral:   Interview type:  Family Other interview type:   CSW spoke with pt daughter who is her caregiver    PSYCHOSOCIAL DATA Living Status:  WITH ADULT CHILDREN Admitted from facility:   Level of care:   Primary support name:  Kara Rice [phone] Primary support relationship to patient:  CHILD, ADULT Degree of support available:   Pt has good support from family. Pt daughter, Kara Rice [phone]) is pt caregiver and pt son Kara Rice is HCPOA    CURRENT CONCERNS Current Concerns  Post-Acute Placement   Other Concerns:    SOCIAL WORK ASSESSMENT / PLAN CSW informed that pt daughter/caregiver, Kara Rice, would like to speak with CSW. CSW met with pt daughter and son-in-law outside of pt room. Pt daughter expressed concern about pt discharge plans. Pt currently resides with pt daughter but pt daughter feels as though she is no longer able to care for her in her home. Pt daughter told CSW that pt son has HCPOA and she is unsure of his feelings for dc plan. CSW is waiting to confirm pt insurance and awaiting PT eval before speaking with pt son about desires for discharge. CSW left message with financial counselor and informed pt nurse that we will need a PT/OT eval for insurance authorization if SNF is the dc plan.    Pt daughter asked CSW not to disclose to pt son that she initiated conversation about SNF. CSW will speak with pt son and make an updated note once pt insurance is confirmed.   Assessment/plan status:  Psychosocial Support/Ongoing Assessment of Needs Other assessment/ plan:   Information/referral to community resources:   None needed  at this time    PATIENT'S/FAMILY'S RESPONSE TO PLAN OF CARE: Pt daughter/caregiver made clear that pt will not be able to return to her home at dc.       Sumiya Mamaril, LCSWA [phone]

## 2012-09-29 NOTE — Progress Notes (Signed)
Occupational Therapy Evaluation Patient Details Name: Kara Rice MRN: [id number] DOB: 30-Sep-1917 Today's Date: 09/29/2012 Time: 4098-1191 OT Time Calculation (min): 21 min  OT Assessment / Plan / Recommendation History of present illness   [name] with history of dementia, DVT (on chronic Coumadin), venous stasis with leg ulcers, COPD on home O2, remote colon cancer, CHF who presented to [facility] today upon emergent transfer from [facility]. The daughter took the patient to her wound care appointment today and she apparently became very SOB and wheezy. EMS was called but the patient refused for them to touch her and refused ambulance ride at that time. EMS urged the patient to go to the ER, so she agreed to go via her family's car. In the ER, venous duplex neg for DVT. It appears that initial EKG showed NSR with nonspecific ST changes. Troponin returned positive at 1.89. F/u EKG showed anterolateral ST elevation thus code STEMI was called. The patient received 324mg  ASA, initiation of heparin, and was transferred emergently to the [facility] lab at [facility]. Per her daughter, the patient has not complained of CP whatsoever. The patient was unable to provide any relevant history due to her dementia. She knew she was having a heart attack but did not recall the details leading up to this. Daughter's # Donata Clay [phone]. The pt lives with her daughter who assists with all ADLs. Her son is on his way to the hospital     Clinical Impression   PTA, pt lived with daughter who provided assistance with all ADL. Pt is not oriented to place or situation, and appears pleasantly confused. Pt states she lives alone, drives and actually had a flat tire which she changed on the way to the hospital in [place], where she is now. Pt with 3/4 dyspnea with minimal activity, requiring mod A to get to chair due to fatigue. Pt will need 24/7 assist, which primary caregiver can not provide.  Rec SNF for D/C plans. Family may want to consider SNF with memory care unit.     OT Assessment  All further OT needs can be met in the next venue of care    Follow Up Recommendations  SNF    Barriers to Discharge  decreased caregiver support    Equipment Recommendations  None recommended by OT    Recommendations for Other Services    Frequency       Precautions / Restrictions Precautions Precautions: Fall   Pertinent Vitals/Pain Dyspnea 3/4 . O2 SATS 97     ADL  Upper Body Bathing: Supervision/safety;Minimal assistance Where Assessed - Upper Body Bathing: Supported sitting Lower Body Bathing: Moderate assistance Where Assessed - Lower Body Bathing: Supported sit to stand Upper Body Dressing: Moderate assistance Where Assessed - Upper Body Dressing: Unsupported sitting Lower Body Dressing: Moderate assistance Where Assessed - Lower Body Dressing: Supported sit to Pharmacist, hospital: Moderate assistance Toilet Transfer Method: Stand pivot Equipment Used: Gait belt Transfers/Ambulation Related to ADLs: Min A ADL Comments: 3/4 dyspnea with minimal activity    OT Diagnosis: Generalized weakness;Cognitive deficits  OT Problem List: Decreased strength;Decreased activity tolerance;Decreased cognition;Decreased safety awareness;Decreased knowledge of use of DME or AE;Decreased knowledge of precautions;Cardiopulmonary status limiting activity;Increased edema OT Treatment Interventions:     OT Goals(Current goals can be found in the care plan section)    Visit Information  Last OT Received On: 09/29/12 Assistance Needed: +1       Prior Functioning  Home Living Family/patient expects to be discharged to:: Skilled nursing facility Prior Function Level of Independence: Needs assistance ADL's / Homemaking Assistance Needed: mod tp Max A Communication Communication: No difficulties         Vision/Perception Vision - History Baseline Vision: Wears glasses all  the time   Cognition  Cognition Arousal/Alertness: Awake/alert Behavior During Therapy: WFL for tasks assessed/performed Overall Cognitive Status: No family/caregiver present to determine baseline cognitive functioning    Extremity/Trunk Assessment Upper Extremity Assessment Upper Extremity Assessment: Generalized weakness Lower Extremity Assessment Lower Extremity Assessment: Defer to PT evaluation Cervical / Trunk Assessment Cervical / Trunk Assessment: Kyphotic     Mobility Bed Mobility Bed Mobility: Supine to Sit;Sitting - Scoot to Edge of Bed Supine to Sit: 3: Mod assist Sitting - Scoot to Edge of Bed: 3: Mod assist Transfers Transfers: Sit to Stand;Stand to Sit Sit to Stand: 4: Min assist;From bed Stand to Sit: 3: Mod assist;To chair/3-in-1 Details for Transfer Assistance: uncontrolled descent     Exercise     Balance  min A   End of Session OT - End of Session Equipment Utilized During Treatment: Gait belt Activity Tolerance: Patient limited by fatigue Patient left: in chair;with call bell/phone within reach;with chair alarm set Nurse Communication: Mobility status  GO     Deven Furia,HILLARY 09/29/2012, 2:50 PM [facility], OTR/L  [phone] 09/29/2012

## 2012-09-29 NOTE — Progress Notes (Signed)
CSW able to get in contact with pt son however he was unable to hear on the phone and asked CSW to speak to his wife. CSW informed pt daughter-in-law that PT/OT are recommending SNF and that pt daughter/caregiver has stated she will be unable to have pt return to her home and provide the care she needs. Pt daughter-in-law relayed that pt will not be able to dc to their home either so SNF will have to be the dc plan. Pt daughter-in-law stated to CSW that pt daughter should be consulted about this decision and that CSW need to speak with her. CSW will attempt to speak with pt son who has HCPOA in person tomorrow and clarify who will be making the decisions on which facility.   Herminio Kniskern, LCSWA [phone]

## 2012-09-30 ENCOUNTER — Inpatient Hospital Stay (HOSPITAL_COMMUNITY): Payer: Medicare HMO

## 2012-09-30 DIAGNOSIS — L97909 Non-pressure chronic ulcer of unspecified part of unspecified lower leg with unspecified severity: Secondary | ICD-10-CM

## 2012-09-30 LAB — CBC
HCT: 29.5 % — ABNORMAL LOW (ref 36.0–46.0)
Hemoglobin: 9.6 g/dL — ABNORMAL LOW (ref 12.0–15.0)
RBC: 3.16 MIL/uL — ABNORMAL LOW (ref 3.87–5.11)
WBC: 3.9 10*3/uL — ABNORMAL LOW (ref 4.0–10.5)

## 2012-09-30 LAB — BASIC METABOLIC PANEL
BUN: 21 mg/dL (ref 6–23)
Chloride: 104 mEq/L (ref 96–112)
GFR calc Af Amer: 40 mL/min — ABNORMAL LOW (ref >90)
Potassium: 4.5 mEq/L (ref 3.5–5.1)
Sodium: 136 mEq/L (ref 135–145)

## 2012-09-30 MED ORDER — FUROSEMIDE 10 MG/ML IJ SOLN
40.0000 mg | Freq: Once | INTRAMUSCULAR | Status: AC
Start: 2012-09-30 — End: 2012-09-30
  Administered 2012-09-30: 40 mg via INTRAVENOUS
  Filled 2012-09-30: qty 4

## 2012-09-30 MED ORDER — SPIRONOLACTONE 12.5 MG HALF TABLET
12.5000 mg | ORAL_TABLET | Freq: Every day | ORAL | Status: DC
  Administered 2012-09-30 – 2012-10-04 (×5): 12.5 mg via ORAL
  Filled 2012-09-30 (×5): qty 1

## 2012-09-30 NOTE — Progress Notes (Addendum)
CSW received bed offer from [facility] rehab center in [place], [place] which was families first choice. Facility is unable to accept weekend discharges. CSW left message for family to inform of bed offer. CSW confirmed with [facility] that family would not be charged upfront fee and facility is within range.   Ishaan Villamar, LCSWA [phone]

## 2012-09-30 NOTE — Clinical Documentation Improvement (Signed)
THIS DOCUMENT IS NOT A PERMANENT PART OF THE MEDICAL RECORD  Please update your documentation with the medical record to reflect your response to this query. If you need assistance,  please call [phone]  09/30/12  Dear Dr/Associates,  In a better effort to capture your patient's severity of illness, reflect appropriate length of stay and utilization of resources, a review of the patient medical record has revealed the following indicators the diagnosis of Heart Failure.   Based on your clinical judgment, please clarify and document in a progress note and/or discharge summary the clinical condition associated with the following supporting information: In responding to this query please exercise your independent judgment.  The fact that a query is asked, does not imply that any particular answer is desired or expected.  Possible Clinical Conditions?  Acute Systolic Congestive Heart Failure  Acute on Chronic Systolic Congestive Heart Failure  Chronic systolic Congestive Heart Failure  Other Condition  Cannot Clinically Determine  Supporting Information:  Risk factors:  AMI, Congestive heart failure. Chronic obstructive pulmonary disease. CAD; HTN  Signs & Symptoms: symptoms of dyspnea without chest pain.  Diagnostics:- Left ventricle: Septal apical mid and distal anterior wall hypokinesis as well as apical inferior wall hypokineses The cavity size was moderately dilated. Wall thickness was normal. The estimated ejection fraction was 30%                       8/19 CXR - minimal L effusion or pleural scarring, otherwise no acute abnormality, cardiac enlargement Treatment: Lasix 40 mgs IV 8/22  Reviewed: additional documentation in the medical record  Thank You,  Amada Kingfisher  RN, BSN, CCM [phone] [email or web address] Clinical Documentation Specialist: Health Information Management [place]

## 2012-09-30 NOTE — Progress Notes (Signed)
CARDIAC REHAB PHASE I   PRE:  Rate/Rhythm: 56 SB    BP: sitting 110/50    SaO2: 98 2L  MODE:  Ambulation: 250 ft   POST:  Rate/Rhythm: 76 SR    BP: sitting 120/50     SaO2: 98 2L  Pt reluctant to walk. Sts she is "too high up". Used RW, assist x2 and 2L O2. Wheezing at times, esp when she rested. On return to room pt c/o left arm pain. Then in room pt c/o chest and left arm/shoulder pain. To BSC, then pt denied CP/arm pain when asked. Sts she feels good in recliner except SOB. On O2 2L. Pt seems to change how she feels from minute to minute. Pleasant. Will f/u. PT to see in pm. [phone]   Elissa Lovett Wiscon CES, ACSM 09/30/2012 11:19 AM

## 2012-09-30 NOTE — Progress Notes (Addendum)
Clinical Social Work Department CLINICAL SOCIAL WORK PLACEMENT NOTE 09/30/2012  Patient:  Kara Rice, Kara Rice  Account Number:  [id number] Admit date:  09/27/2012  Clinical Social Worker:  Sharol Harness, Theresia Majors  Date/time:  09/30/2012 08:30 AM  Clinical Social Work is seeking post-discharge placement for this patient at the following level of care:   SKILLED NURSING   (*CSW will update this form in Epic as items are completed)   09/30/2012  Patient/family provided with [facility] Department of Clinical Social Work's list of facilities offering this level of care within the geographic area requested by the patient (or if unable, by the patient's family).  09/30/2012  Patient/family informed of their freedom to choose among providers that offer the needed level of care, that participate in Medicare, Medicaid or managed care program needed by the patient, have an available bed and are willing to accept the patient.  09/30/2012  Patient/family informed of [facility]' ownership interest in [facility], as well as of the fact that they are under no obligation to receive care at this facility.  PASARR submitted to EDS on  PASARR number received from EDS on  [place] SNF search  FL2 transmitted to all facilities in geographic area requested by pt/family on  09/30/2012 FL2 transmitted to all facilities within larger geographic area on 09/30/2012  Patient informed that his/her managed care company has contracts with or will negotiate with  certain facilities, including the following:     Patient/family informed of bed offers received: 09/30/2012 Patient chooses bed at [facility] in [place], [place] Physician recommends and patient chooses bed at    Patient to be transferred to [facility] on   Patient to be transferred to facility by [facility]  The following physician request were entered in Epic:   Additional CommentsSharol Harness, LCSWA [phone]

## 2012-09-30 NOTE — Progress Notes (Signed)
Physical Therapy Treatment Patient Details Name: Kara Rice MRN: [id number] DOB: 22-Dec-1917 Today's Date: 09/30/2012 Time: 1443-1500 PT Time Calculation (min): 17 min  PT Assessment / Plan / Recommendation  History of Present Illness [name] with history of dementia, DVT (on chronic Coumadin), venous stasis with leg ulcers, COPD on home O2, remote colon cancer, CHF who presented to [facility] today upon emergent transfer from [facility].  Admitted for STEMI, s/p Left heart catheterization, Selective Coronary Angiography,  PTCA and stenting of the first diagonal. Attempted PTCA of the LAD.   PT Comments   Pt is very confused, pleasantly, but still confused. She has limited endurance, but is mobilizing well with min assist.  She is at high risk of falls between her physical and cognitive deficits.  Her safest d/c destination is SNF unless family can provide 24 hour care.    Follow Up Recommendations  SNF     Does the patient have the potential to tolerate intense rehabilitation    NA  Barriers to Discharge   Decreased caregiver support (no 24 hour care?)      Equipment Recommendations  None recommended by PT    Recommendations for Other Services   None  Frequency Min 3X/week   Progress towards PT Goals Progress towards PT goals: Progressing toward goals  Plan Current plan remains appropriate    Precautions / Restrictions Precautions Precautions: Fall Precaution Comments: DOE, chronic O2 use   Pertinent Vitals/Pain See vitals flow sheet.     Mobility  Bed Mobility Bed Mobility: Not assessed (pt OOB in recliner chair) Transfers Sit to Stand: 4: Min assist;With upper extremity assist;With armrests;From chair/3-in-1 Stand to Sit: 4: Min assist;With upper extremity assist;With armrests;To chair/3-in-1 Details for Transfer Assistance: min assist to support trunk during transitions.   Ambulation/Gait Ambulation/Gait Assistance: 4: Min guard Ambulation  Distance (Feet): 180 Feet Assistive device: Other (Comment) (WC to hold O2 since there were no carriers on the floor) Ambulation/Gait Assistance Details: Pt using bil upper extremity support, walking and talking increased DOE.  Verbal cues to just walk and try to limit talking to help with breathing, but pt did not abide by cues.   Gait Pattern: Step-through pattern;Shuffle;Trunk flexed      PT Goals (current goals can now be found in the care plan section) Acute Rehab PT Goals Patient Stated Goal: to go home today  Visit Information  Last PT Received On: 09/30/12 Assistance Needed: +1 History of Present Illness: [name] with history of dementia, DVT (on chronic Coumadin), venous stasis with leg ulcers, COPD on home O2, remote colon cancer, CHF who presented to [facility] today upon emergent transfer from [facility].  Admitted for STEMI, s/p Left heart catheterization, Selective Coronary Angiography,  PTCA and stenting of the first diagonal. Attempted PTCA of the LAD.    Subjective Data  Subjective: Pt talking about being 79 y.o (she is 35).   Patient Stated Goal: to go home today   Cognition  Cognition Arousal/Alertness: Awake/alert Behavior During Therapy: WFL for tasks assessed/performed Overall Cognitive Status: History of cognitive impairments - at baseline       End of Session PT - End of Session Equipment Utilized During Treatment: Oxygen Activity Tolerance: Patient limited by fatigue (limited by DOE) Patient left: in chair;with call bell/phone within reach;with chair alarm set       Reniyah Gootee B. Kara Rice, PT, DPT [phone]   09/30/2012, 3:11 PM

## 2012-09-30 NOTE — Progress Notes (Addendum)
TELEMETRY: Reviewed telemetry pt in NSR: Filed Vitals:   09/30/12 0549 09/30/12 0600 09/30/12 0614 09/30/12 0834  BP: 105/53   130/57  Pulse: 58   64  Temp: 98.3 F (36.8 C)     TempSrc: Oral     Resp: 20  20   Height:      Weight:  153 lb 14.1 oz (69.8 kg)    SpO2: 100%  100% 94%    Intake/Output Summary (Last 24 hours) at 09/30/12 0842 Last data filed at 09/29/12 1300  Gross per 24 hour  Intake      0 ml  Output    401 ml  Net   -401 ml    SUBJECTIVE: Denies any chest pain. SOB just getting up to BR and pretty weak.   LABS: Basic Metabolic Panel:  Recent Labs  96/04/54 0500 09/30/12 0620  NA 138 136  K 4.7 4.5  CL 106 104  CO2 25 25  GLUCOSE 102* 106*  BUN 20 21  CREATININE 1.32* 1.29*  CALCIUM 9.2 8.9   Liver Function Tests:  Recent Labs  09/28/12 0220  AST 20  ALT 8  ALKPHOS 52  BILITOT 0.4  PROT 5.3*  ALBUMIN 2.6*   CBC:  Recent Labs  09/28/12 0730 09/30/12 0620  WBC 5.0 3.9*  HGB 9.5* 9.6*  HCT 28.2* 29.5*  MCV 90.4 93.4  PLT 154 155   Cardiac Enzymes:  Recent Labs  09/27/12 2009 09/28/12 0220 09/28/12 0730  TROPONINI 1.26* 2.69* 2.45*   Fasting Lipid Panel:  Recent Labs  09/28/12 0220  CHOL 134  HDL 34*  LDLCALC 84  TRIG 78  CHOLHDL 3.9   Thyroid Function Tests:  Recent Labs  09/27/12 2009  TSH 2.606     Radiology/Studies:  CXR at [facility] hospital-minimal left effusion/scarring. Cardiac enlargement.  Ecg: NSR, LAD, anteroseptal infarct.   Echo:Study Conclusions  - Left ventricle: Septal apical mid and distal anterior wall hypokinesis as well as apical inferior wall hypokinesos The cavity size was moderately dilated. Wall thickness was normal. The estimated ejection fraction was 30%. - Atrial septum: No defect or patent foramen ovale was identified.   PHYSICAL EXAM General: very pleasant, well nourished, in no acute distress. Head: Normal Neck: Negative for carotid bruits. JVD not  elevated. Lungs: Bilateral wheezes. Heart: RRR S1 S2 without murmurs, rubs, or gallops.  Abdomen: Soft, non-tender, non-distended with normoactive bowel sounds. No hepatomegaly. No rebound/guarding. No obvious abdominal masses. Msk:  Strength and tone appears normal for age. Extremities: 1+edema.  Distal pedal pulses are 2+ and equal bilaterally. Wound dressing on right leg.  Neuro: Alert and oriented X 3. Moves all extremities spontaneously. No focal findings. Psych:  Responds to questions appropriately with a normal affect.  ASSESSMENT AND PLAN: 1. Anterior STEMI. S/p BMS of large first diagonal. Unsuccessful attempt to open LAD. Based on fairly small enzyme rise I suspect LAD occlusion was chronic and current event was related to diagonal occlusion. Will continue ASA and Plavix. Continue beta blocker and statin.  2. History of DVT/PE 3 years ago. On coumadin previously. Will hold coumadin now that she is on ASA and plavix. LE venous doppler yesterday was negative for DVT at [facility]. On SQ heparin. 3.Acute CHF with Ischemic cardiomyopathy with EF 30%. LV function prior to hospitalization unknown. On ACEi and carvedilol. Will treat with coreg and ACEi. BP soft. Plan to start aldactone today. IV lasix x 1 today. Will check CXR. 4. HTN 5. DNR  6. Leg ulcer. Wound care consult. 7. COPD  Disposition: based on SW, PT, OT evaluation it looks like she will need SNF post DC. FL-2 signed in chart. Not ready for DC yet- maybe early next week.  Active Problems:   ST elevation myocardial infarction (STEMI) of anterolateral wall   CHF (congestive heart failure)   CKD (chronic kidney disease) stage 3, GFR 30-59 ml/min   COPD (chronic obstructive pulmonary disease)   Leg ulcer    Signed, Peter Swaziland MD,FACC 09/30/2012 8:42 AM

## 2012-10-01 LAB — BASIC METABOLIC PANEL
BUN: 26 mg/dL — ABNORMAL HIGH (ref 6–23)
CO2: 27 mEq/L (ref 19–32)
Chloride: 104 mEq/L (ref 96–112)
Glucose, Bld: 92 mg/dL (ref 70–99)
Potassium: 4.4 mEq/L (ref 3.5–5.1)
Sodium: 138 mEq/L (ref 135–145)

## 2012-10-01 NOTE — Progress Notes (Signed)
Filed Vitals:   09/30/12 1133 09/30/12 1300 09/30/12 2100 10/01/12 0500  BP: 104/68 121/77 103/45 106/50  Pulse:  80 65 56  Temp:  98.2 F (36.8 C) 98.2 F (36.8 C) 98.2 F (36.8 C)  TempSrc:      Resp:  18 20 20   Height:      Weight:    151 lb 10.8 oz (68.8 kg)  SpO2:  99% 95% 100%    Intake/Output Summary (Last 24 hours) at 10/01/12 1002 Last data filed at 10/01/12 0837  Gross per 24 hour  Intake    600 ml  Output    601 ml  Net     -1 ml    SUBJECTIVE: Ms. Kara Rice is a 79 yo admitted with CP and STEMI.  S/p BMS to the diag.  Dr. Swaziland was unable to open the chronic LAD occlusion.     Denies any chest pain. SOB just getting up to BR and pretty weak.   LABS: Basic Metabolic Panel:  Recent Labs  16/10/96 0620 10/01/12 0440  NA 136 138  K 4.5 4.4  CL 104 104  CO2 25 27  GLUCOSE 106* 92  BUN 21 26*  CREATININE 1.29* 1.40*  CALCIUM 8.9 8.7   Liver Function Tests: No results found for this basename: AST, ALT, ALKPHOS, BILITOT, PROT, ALBUMIN,  in the last 72 hours CBC:  Recent Labs  09/30/12 0620  WBC 3.9*  HGB 9.6*  HCT 29.5*  MCV 93.4  PLT 155   Cardiac Enzymes: No results found for this basename: CKTOTAL, CKMB, CKMBINDEX, TROPONINI,  in the last 72 hours Fasting Lipid Panel: No results found for this basename: CHOL, HDL, LDLCALC, TRIG, CHOLHDL, LDLDIRECT,  in the last 72 hours Thyroid Function Tests: No results found for this basename: TSH, T4TOTAL, FREET3, T3FREE, THYROIDAB,  in the last 72 hours   Radiology/Studies:  CXR at [facility] hospital-minimal left effusion/scarring. Cardiac enlargement.  Ecg: NSR, LAD, anteroseptal infarct.   Echo:Study Conclusions  - Left ventricle: Septal apical mid and distal anterior wall hypokinesis as well as apical inferior wall hypokinesos The cavity size was moderately dilated. Wall thickness was normal. The estimated ejection fraction was 30%. - Atrial septum: No defect or patent foramen ovale  was identified.   PHYSICAL EXAM General: very pleasant, well nourished, in no acute distress. Head: Normal Neck: Negative for carotid bruits. JVD not elevated. Lungs: few basilar rales. Heart: RRR S1 S2 without murmurs, rubs, or gallops.  Abdomen: Soft, non-tender, non-distended with normoactive bowel sounds. No hepatomegaly. No rebound/guarding. No obvious abdominal masses. Msk:  Strength and tone appears normal for age. Extremities: 1+edema.  Distal pedal pulses are 2+ and equal bilaterally. Wound dressing on right leg.  Neuro: Alert and oriented X 3. Moves all extremities spontaneously. No focal findings. Psych:  Responds to questions appropriately with a normal affect.  ASSESSMENT AND PLAN: 1. Anterior STEMI. S/p BMS of large first diagonal. Unsuccessful attempt to open LAD. Based on fairly small enzyme rise I suspect LAD occlusion was chronic and current event was related to diagonal occlusion. Will continue ASA and Plavix. Continue beta blocker and statin.   2. History of DVT/PE 3 years ago. On coumadin previously. Will hold coumadin now that she is on ASA and plavix. LE venous doppler yesterday was negative for DVT at [facility]. On SQ heparin.  3.Acute CHF with Ischemic cardiomyopathy with EF 30%. LV function prior to hospitalization unknown. On Lisinopril 5 daily, carvedilol 6.25 BID,  aldactone 12. 5 daily  4. HTN 5. DNR 6. Leg ulcer. Wound care consult. 7. COPD  Disposition: based on SW, PT, OT evaluation it looks like she will need SNF post DC. FL-2 signed in chart. Not ready for DC yet- maybe early next week.  Active Problems:   ST elevation myocardial infarction (STEMI) of anterolateral wall   Acute systolic CHF (congestive heart failure)   CKD (chronic kidney disease) stage 3, GFR 30-59 ml/min   COPD (chronic obstructive pulmonary disease)   Leg ulcer    Signed, Elyn Aquas. MD,FACC 10/01/2012 10:02 AM

## 2012-10-01 NOTE — Progress Notes (Signed)
CARDIAC REHAB PHASE I   PRE:  Rate/Rhythm: 61 SR    BP: sitting 124/64    SaO2: 98 2L  MODE:  Ambulation: 200 ft   POST:  Rate/Rhythm: 70 SR    BP: sitting 104/64     SaO2: 98 2L  Pt eager to walk, assist x2 with w/c and 2L O2. Pt continues with intermittent wheezing. Stopped to rest when she did this. Pt developed left arm aching again today. St it hurts from hand to shoulder. Denied sx in chest. Stopped walking and put pt in w/c and rolled back to room. Pt denied left arm pain once in room. Assisted to recliner, stable, left on 2L O2. Will notify RN. [phone]  Elissa Lovett Osburn CES, ACSM 10/01/2012 3:16 PM

## 2012-10-02 LAB — BASIC METABOLIC PANEL
BUN: 35 mg/dL — ABNORMAL HIGH (ref 6–23)
CO2: 27 mEq/L (ref 19–32)
Chloride: 103 mEq/L (ref 96–112)
Creatinine, Ser: 1.42 mg/dL — ABNORMAL HIGH (ref 0.50–1.10)
Potassium: 4.8 mEq/L (ref 3.5–5.1)

## 2012-10-02 NOTE — Progress Notes (Addendum)
Filed Vitals:   10/01/12 0500 10/01/12 1407 10/01/12 2021 10/02/12 0515  BP: 106/50 124/64 92/55 109/63  Pulse: 56 64 50 58  Temp: 98.2 F (36.8 C) 97.9 F (36.6 C) 97.8 F (36.6 C) 97.8 F (36.6 C)  TempSrc:  Oral  Oral  Resp: 20 18 16 24   Height:      Weight: 151 lb 10.8 oz (68.8 kg)   156 lb 8.4 oz (71 kg)  SpO2: 100% 98% 100% 100%    Intake/Output Summary (Last 24 hours) at 10/02/12 1610 Last data filed at 10/02/12 0854  Gross per 24 hour  Intake   1260 ml  Output      4 ml  Net   1256 ml    SUBJECTIVE: Kara Rice is a 79 yo admitted with CP and STEMI.  S/p BMS to the diag.  Dr. Swaziland was unable to open the chronic LAD occlusion.     Denies any chest pain. SOB just getting up to BR and pretty weak.   LABS: Basic Metabolic Panel:  Recent Labs  96/04/54 0440 10/02/12 0415  NA 138 135  K 4.4 4.8  CL 104 103  CO2 27 27  GLUCOSE 92 109*  BUN 26* 35*  CREATININE 1.40* 1.42*  CALCIUM 8.7 8.6   Liver Function Tests: No results found for this basename: AST, ALT, ALKPHOS, BILITOT, PROT, ALBUMIN,  in the last 72 hours CBC:  Recent Labs  09/30/12 0620  WBC 3.9*  HGB 9.6*  HCT 29.5*  MCV 93.4  PLT 155   Cardiac Enzymes: No results found for this basename: CKTOTAL, CKMB, CKMBINDEX, TROPONINI,  in the last 72 hours Fasting Lipid Panel: No results found for this basename: CHOL, HDL, LDLCALC, TRIG, CHOLHDL, LDLDIRECT,  in the last 72 hours Thyroid Function Tests: No results found for this basename: TSH, T4TOTAL, FREET3, T3FREE, THYROIDAB,  in the last 72 hours   Radiology/Studies:  CXR at [facility] hospital-minimal left effusion/scarring. Cardiac enlargement.  Ecg: NSR, LAD, anteroseptal infarct.   Echo:Study Conclusions  - Left ventricle: Septal apical mid and distal anterior wall hypokinesis as well as apical inferior wall hypokinesos The cavity size was moderately dilated. Wall thickness was normal. The estimated ejection fraction was  30%. - Atrial septum: No defect or patent foramen ovale was identified.   PHYSICAL EXAM General: very pleasant, well nourished, in no acute distress. Head: Normal Neck: Negative for carotid bruits. JVD not elevated. Lungs: she is wheezing this am.  She says this is common for her Heart: RRR S1 S2 without murmurs, rubs, or gallops.  Abdomen: Soft, non-tender, non-distended with normoactive bowel sounds. No hepatomegaly. No rebound/guarding. No obvious abdominal masses. Msk:  Strength and tone appears normal for age. Extremities: 1+edema.  Distal pedal pulses are 2+ and equal bilaterally. Wound dressing on right leg.  Neuro: Alert and oriented X 3. Moves all extremities spontaneously. No focal findings. Psych:  Responds to questions appropriately with a normal affect.  ASSESSMENT AND PLAN: 1. Anterior STEMI. S/p BMS of large first diagonal. Unsuccessful attempt to open LAD. Based on fairly small enzyme rise I suspect LAD occlusion was chronic and current event was related to diagonal occlusion. Will continue ASA and Plavix. Continue beta blocker and statin.   2. History of DVT/PE 3 years ago. On coumadin previously. Will hold coumadin now that she is on ASA and plavix. LE venous doppler yesterday was negative for DVT at [facility]. On SQ heparin.  3.Acute CHF with Ischemic  cardiomyopathy with EF 30%. LV function prior to hospitalization unknown. On Lisinopril 5 daily, carvedilol 6.25 BID, aldactone 12. 5 daily  4. HTN 5. DNR 6. Leg ulcer. Wound care consult. 7. COPD - she has some wheezing on exam today.  Continue albuterol.   Disposition: based on SW, PT, OT evaluation it looks like she will need SNF post DC. FL-2 signed in chart. Not ready for DC yet- maybe early next week.  Will have her ambulate in the halls today with assistance.  Active Problems:   ST elevation myocardial infarction (STEMI) of anterolateral wall   Acute systolic CHF (congestive heart failure)   CKD  (chronic kidney disease) stage 3, GFR 30-59 ml/min   COPD (chronic obstructive pulmonary disease)   Leg ulcer    Kara Rice., MD, [facility] 10/02/2012, 9:21 AM Office - [phone] Pager [phone]

## 2012-10-03 ENCOUNTER — Encounter (HOSPITAL_COMMUNITY): Payer: Self-pay

## 2012-10-03 ENCOUNTER — Inpatient Hospital Stay (HOSPITAL_COMMUNITY): Payer: Medicare HMO

## 2012-10-03 LAB — BASIC METABOLIC PANEL
CO2: 26 mEq/L (ref 19–32)
Calcium: 9.2 mg/dL (ref 8.4–10.5)
Chloride: 102 mEq/L (ref 96–112)
Creatinine, Ser: 1.27 mg/dL — ABNORMAL HIGH (ref 0.50–1.10)
Glucose, Bld: 93 mg/dL (ref 70–99)

## 2012-10-03 MED ORDER — FUROSEMIDE 20 MG PO TABS
20.0000 mg | ORAL_TABLET | Freq: Once | ORAL | Status: AC
  Administered 2012-10-03: 20 mg via ORAL
  Filled 2012-10-03: qty 1

## 2012-10-03 NOTE — Progress Notes (Signed)
Patient ID: Kara Rice, female   DOB: Oct 16, 1917, 79 y.o.   MRN: [id number]   SUBJECTIVE:  Patient is stable today. She hopes to leave the hospital soon. She lives alone. She has been staying with her sister. She has been ambulating with rehabilitation. There has been mention in the record previously that she may need to go to a nursing facility. She thinks she is going to her sister's home. I will request further input from care management to help communicate with the discharge needs. It is my understanding that the Fl2 has already been signed. She has mild edema on exam. I'm concerned that she may be mildly volume overloaded. She's not ready to leave the hospital today. She may be ready tomorrow.   Filed Vitals:   10/02/12 1411 10/02/12 2035 10/03/12 0510 10/03/12 0627  BP: 107/50 109/54 108/58   Pulse: 61 59 63   Temp: 98.3 F (36.8 C) 98 F (36.7 C) 97.7 F (36.5 C)   TempSrc: Oral     Resp: 20 17 17    Height:      Weight:      SpO2: 100% 99% 99% 99%    Intake/Output Summary (Last 24 hours) at 10/03/12 0818 Last data filed at 10/02/12 1700  Gross per 24 hour  Intake    960 ml  Output      0 ml  Net    960 ml    LABS: Basic Metabolic Panel:  Recent Labs  40/98/11 0415 10/03/12 0420  NA 135 135  K 4.8 4.4  CL 103 102  CO2 27 26  GLUCOSE 109* 93  BUN 35* 31*  CREATININE 1.42* 1.27*  CALCIUM 8.6 9.2   Liver Function Tests: No results found for this basename: AST, ALT, ALKPHOS, BILITOT, PROT, ALBUMIN,  in the last 72 hours No results found for this basename: LIPASE, AMYLASE,  in the last 72 hours CBC: No results found for this basename: WBC, NEUTROABS, HGB, HCT, MCV, PLT,  in the last 72 hours Cardiac Enzymes: No results found for this basename: CKTOTAL, CKMB, CKMBINDEX, TROPONINI,  in the last 72 hours BNP: No components found with this basename: POCBNP,  D-Dimer: No results found for this basename: DDIMER,  in the last 72 hours Hemoglobin A1C: No results  found for this basename: HGBA1C,  in the last 72 hours Fasting Lipid Panel: No results found for this basename: CHOL, HDL, LDLCALC, TRIG, CHOLHDL, LDLDIRECT,  in the last 72 hours Thyroid Function Tests: No results found for this basename: TSH, T4TOTAL, FREET3, T3FREE, THYROIDAB,  in the last 72 hours  RADIOLOGY: Dg Chest 2 View  09/30/2012   *RADIOLOGY REPORT*  Clinical Data: Shortness of breath  CHEST - 2 VIEW  Comparison: 09/27/2012  Findings: The cardiac shadow is stable.  The lungs are clear bilaterally.  No focal infiltrate is seen.  IMPRESSION: No acute abnormality noted.   Original Report Authenticated By: Alcide Clever, M.D.    PHYSICAL EXAM :  Patient is oriented. She is comfortable in bed. There is no jugular venous distention. Lungs reveal scattered rales. Cardiac exam reveals S1 and S2. There is 1+ peripheral edema.   TELEMETRY:   I have reviewed telemetry today. There is normal sinus rhythm.   ASSESSMENT AND PLAN:    ST elevation myocardial infarction (STEMI) of anterolateral wall     No further workup is planned. The patient is on aspirin and Plavix. She had been on Coumadin in the past. Dopplers are negative this  admission. Her Coumadin is on hold at this time while she is on aspirin and Plavix.    Acute systolic CHF (congestive heart failure)     The input and output report from yesterday is not complete. It is difficult for me to fully assess the trend in her volume status. Initially her creatinine was as low as 1.05. It went up to 1.4. It is now trending back to 1.27. She was started immediately on spironolactone. She's not in Lasix. Her volume status needs to be assessed further. Chest x-ray will be done today. I will be giving her 20 mg of oral Lasix. We need to follow her input and output carefully today. Based on the results of the x-ray of her overall status and her renal function tomorrow we can decide if she is ready to leave the hospital.    CKD (chronic kidney  disease) stage 3, GFR 30-59 ml/min      As I outlined above, creatinine today of 1.27 is better than it has been. However is not back to baseline. Despite this I feel that she is volume overloaded. Her ejection fraction is 30%. I've outlined in my plan in the lines above.    COPD (chronic obstructive pulmonary disease)     Willa Rough 10/03/2012 8:18 AM

## 2012-10-03 NOTE — Progress Notes (Signed)
CSW (Clinical Child psychotherapist) informed that pt may be ready for [employer]. Pt received bed offer from [facility] in [place], [place]. CSW called facility to inform of possible dc tomorrow. Pt son and daughter are both aware. CSW will follow up when pt is ready for dc.  Miamor Ayler, LCSWA [phone]

## 2012-10-04 ENCOUNTER — Encounter (HOSPITAL_COMMUNITY): Payer: Self-pay | Admitting: Nurse Practitioner

## 2012-10-04 DIAGNOSIS — I2510 Atherosclerotic heart disease of native coronary artery without angina pectoris: Secondary | ICD-10-CM

## 2012-10-04 DIAGNOSIS — I255 Ischemic cardiomyopathy: Secondary | ICD-10-CM

## 2012-10-04 LAB — BASIC METABOLIC PANEL
BUN: 28 mg/dL — ABNORMAL HIGH (ref 6–23)
CO2: 29 mEq/L (ref 19–32)
Calcium: 9.3 mg/dL (ref 8.4–10.5)
GFR calc non Af Amer: 35 mL/min — ABNORMAL LOW (ref >90)
Glucose, Bld: 102 mg/dL — ABNORMAL HIGH (ref 70–99)
Potassium: 4.8 mEq/L (ref 3.5–5.1)

## 2012-10-04 MED ORDER — CARVEDILOL 6.25 MG PO TABS: 6.2500 mg | ORAL_TABLET | Freq: Two times a day (BID) | ORAL | Status: DC

## 2012-10-04 MED ORDER — ASPIRIN 81 MG PO CHEW: 81.0000 mg | CHEWABLE_TABLET | Freq: Every day | ORAL | Status: AC

## 2012-10-04 MED ORDER — SPIRONOLACTONE 25 MG PO TABS: 12.5000 mg | ORAL_TABLET | Freq: Every day | ORAL | Status: AC

## 2012-10-04 MED ORDER — CLOPIDOGREL BISULFATE 75 MG PO TABS: 75.0000 mg | ORAL_TABLET | Freq: Every day | ORAL | Status: AC

## 2012-10-04 MED ORDER — ATORVASTATIN CALCIUM 10 MG PO TABS: 10.0000 mg | ORAL_TABLET | Freq: Every day | ORAL | Status: AC

## 2012-10-04 MED ORDER — LISINOPRIL 5 MG PO TABS: 5.0000 mg | ORAL_TABLET | Freq: Every day | ORAL | Status: AC

## 2012-10-04 NOTE — Progress Notes (Signed)
CSW (Clinical Child psychotherapist) placed pt [employer] with shadow chart. CSW arranged transportation. Pt family, facility, and nurse have been notified. CSW signing off.  Takeru Bose, LCSWA [phone]

## 2012-10-04 NOTE — Progress Notes (Signed)
Patient ID: Kara Rice, female   DOB: 02/13/1917, 79 y.o.   MRN: [id number]   SUBJECTIVE: The patient is feeling well today. She is ready to leave the hospital. Chest x-ray did not show any CHF yesterday. I had given her one small dose of Lasix. There is no recording of her output.   Filed Vitals:   10/03/12 1417 10/03/12 2110 10/03/12 2118 10/04/12 0502  BP: 117/53 122/73  118/50  Pulse: 61 91  57  Temp: 97.5 F (36.4 C) 98.4 F (36.9 C)  98.4 F (36.9 C)  TempSrc: Oral     Resp: 21 20  20   Height:      Weight:    154 lb 5.2 oz (70 kg)  SpO2: 98% 91% 99% 99%   No intake or output data in the 24 hours ending 10/04/12 0730  LABS: Basic Metabolic Panel:  Recent Labs  40/98/11 0420 10/04/12 0450  NA 135 139  K 4.4 4.8  CL 102 105  CO2 26 29  GLUCOSE 93 102*  BUN 31* 28*  CREATININE 1.27* 1.30*  CALCIUM 9.2 9.3   Liver Function Tests: No results found for this basename: AST, ALT, ALKPHOS, BILITOT, PROT, ALBUMIN,  in the last 72 hours No results found for this basename: LIPASE, AMYLASE,  in the last 72 hours CBC: No results found for this basename: WBC, NEUTROABS, HGB, HCT, MCV, PLT,  in the last 72 hours Cardiac Enzymes: No results found for this basename: CKTOTAL, CKMB, CKMBINDEX, TROPONINI,  in the last 72 hours BNP: No components found with this basename: POCBNP,  D-Dimer: No results found for this basename: DDIMER,  in the last 72 hours Hemoglobin A1C: No results found for this basename: HGBA1C,  in the last 72 hours Fasting Lipid Panel: No results found for this basename: CHOL, HDL, LDLCALC, TRIG, CHOLHDL, LDLDIRECT,  in the last 72 hours Thyroid Function Tests: No results found for this basename: TSH, T4TOTAL, FREET3, T3FREE, THYROIDAB,  in the last 72 hours  RADIOLOGY: Dg Chest 2 View  10/03/2012   *RADIOLOGY REPORT*  Clinical Data: CHF, cough  CHEST - 2 VIEW  Comparison: 09/30/2012  Findings: The cardiac shadow is stable.  The lungs are well-aerated  bilaterally without focal infiltrate or sizable effusion.  No bony abnormality is noted.  IMPRESSION: No acute abnormalities seen.  No change from prior exam.   Original Report Authenticated By: Alcide Clever, M.D.   Dg Chest 2 View  09/30/2012   *RADIOLOGY REPORT*  Clinical Data: Shortness of breath  CHEST - 2 VIEW  Comparison: 09/27/2012  Findings: The cardiac shadow is stable.  The lungs are clear bilaterally.  No focal infiltrate is seen.  IMPRESSION: No acute abnormality noted.   Original Report Authenticated By: Alcide Clever, M.D.    PHYSICAL EXAM  patient is feisty this morning. Lungs reveal a few scattered rhonchi. Cardiac exam reveals S1 and S2.   TELEMETRY: Telemetry reveals normal sinus rhythm.   ASSESSMENT AND PLAN:    ST elevation myocardial infarction (STEMI) of anterolateral wall       See my complete note yesterday for the plans concerning her meds.    Acute systolic CHF (congestive heart failure)      Her volume status is stable. She will go home on a small dose of spironolactone. She will not receive any ongoing Lasix.    CKD (chronic kidney disease) stage 3, GFR 30-59 ml/min      Her renal function is stable for now.  COPD (chronic obstructive pulmonary disease)   Leg ulcer  Patient is ready to be discharged. Careful attention has been paid to working with social work to make all arrangements.  Willa Rough 10/04/2012 7:30 AM

## 2012-10-04 NOTE — Discharge Summary (Signed)
Patient ID: ALYCEA SEGOVIANO,  MRN: [id number], DOB/AGE: 09-25-1917 79 y.o.  Admit date: 09/27/2012 Discharge date: 10/04/2012  Primary Cardiologist: to be  Dominga Ferry, MD  Discharge Diagnoses Principal Problem:   ST elevation myocardial infarction (STEMI) of anterolateral wall  **s/p PCI/BMS of the First Diagonal branch this admission.  **Unsuccessful PCI of the LAD->presumed to be chronic total occlusion.  Active Problems:   CAD (coronary artery disease)   Acute systolic CHF (congestive heart failure)  **EF 30% by echo this admission.  **Discharge weight of 154 lbs.   CKD (chronic kidney disease) stage 3, GFR 30-59 ml/min   Cardiomyopathy, ischemic   COPD (chronic obstructive pulmonary disease)   Leg ulcer   H/O DVT/PE  **Previously on Coumadin. Neg LE U/S this admission.  **Coumadin d/c'd this admission.  Allergies Allergies  Allergen Reactions  . Codeine     Hx of nausea with pain meds with this  . Other     Per daughter - can't tolerate pain meds well due to nausea   Procedures  Cardiac Catheterization and Percutaneous Coronary Intervention 8.19.2014 PROCEDURAL FINDINGS Hemodynamics: AO 170/92 with a mean of 125 mmHg LV 173/22 mmHg              Coronary angiography: Coronary dominance: right  Left mainstem: Normal. Left anterior descending (LAD): There is a 90-95% stenosis in the proximal LAD extending into a very large first diagonal branch. The continuation of the LAD after the diagonal is occluded. There were very mild left to left collaterals to the LAD. There were right to left collaterals to the septal perforators.   **The first diagonal was successfully stented using a 3.0 x 24mm Rebel bare-metal stent.**  **Attempts to open the LAD were unsuccessful.**  Left circumflex (LCx): The left circumflex gives rise to 3 marginal branches. There is mild nonobstructive disease in the mid vessel up to 20%. The distal marginal branch has segmental disease up to  30%. Right coronary artery (RCA): The right coronary has an anterior takeoff. It was engaged with a left Amplatz catheter. It is a dominant vessel and is normal. Left ventriculography: Not performed  Final Conclusions:   1. Single vessel occlusive coronary disease. 2. Successful stenting of the first diagonal with a bare-metal stent. Unsuccessful PCI of the LAD.  _____________   2D Echocardiogram 8.20.2014  Study Conclusions  - Left ventricle: Septal apical mid and distal anterior wall   hypokinesis as well as apical inferior wall hypokinesos   The cavity size was moderately dilated. Wall thickness was   normal. The estimated ejection fraction was 30%. - Atrial septum: No defect or patent foramen ovale was   identified. _____________   History of Present Illness  79 year old female with prior history of dementia, COPD on home oxygen, and DVT previously on chronic Coumadin anticoagulation, who was in her usual state of health until 09/27/2012 which became acutely dyspneic while being seen in the wound clinic for lower extremity venous stasis. EMS was called however patient refused to allow them to take her to the ED and instead, her daughter drove her to the [facility] emergency department. There, lower extremity ultrasound was performed and was negative for DVT. Initial ECG showed nonspecific ST changes however troponin returned elevated at 1.89. Followup ECG showed anterolateral ST segment elevation and thus a code STEMI was activated. Patient was treated with aspirin and heparin and transferred to [facility] for emergent diagnostic cardiac catheterization.  Hospital Course  Patient underwent emergent cardiac catheterization  revealing severe proximal LAD disease extending into a large first diagonal branch. The remainder of the LAD was occluded. Multiple attempts were made to cross a wire into the LAD however these were unsuccessful. This ultimately felt that this may represent a  chronic total occlusion of the LAD. Attention was then turned to the first diagonal which was successfully stented using a bare-metal stent as outlined above. Patient tolerated procedure well and post procedure was followed in the cardiac intensive care unit where she will be in for myocardial infarction, eventually peaking her troponin at 2.69.  Pt was maintained on aspirin and plavix.  In the setting of negative LE u/s @ [facility] and current need for asa/plavix, decision was made to discontinue coumadin anticoagulation going forward.  Echocardiogram was carried out on 8/20 and showed an EF of 30% with multiple wall motion abnormalities.  Coreg and lisinopril were initiated and low-dose spironolactone was added later.  On 8/22, she was felt to have mild volume overload and was also treated with 1 dose of IV lasix.  In the setting of stage III CKD, recent contrast exposure, initiation of ACEI and spironolactone, along with diuresis,  pts creatinine bumped to a peak of 1.42 on 8/24 (from 0.95 on admission), but otherwise remained stable.  Volume status remained stable, as did weight.  Follow-up cxr on 8/25 did not show any pulmonary edema.  Throughout admission, pt was evaluated by PT/OT and cardiac rehab.  She often refused therapy and ultimately it was felt that she would benefit from SNF placement.  Case management and social work assisted the patient and family in identifying a suitable nursing facility and she will be discharged to [facility] in [place], [place] today.  She will require a f/u bmet in 1 week given CKD and addition of ACEI/Spironolactone.  We have arranged for cardiology f/u in our [place] office in 3 wks.  Discharge Vitals Blood pressure 118/50, pulse 57, temperature 98.4 F (36.9 C), temperature source Oral, resp. rate 20, height 5\' 2"  (1.575 m), weight 154 lb 5.2 oz (70 kg), SpO2 99.00%.  Filed Weights   10/02/12 0515 10/03/12 0758 10/04/12 0502  Weight: 156 lb 8.4 oz (71  kg) 154 lb 5.2 oz (70 kg) 154 lb 5.2 oz (70 kg)   Labs  CBC Lab Results  Component Value Date   WBC 3.9* 09/30/2012   HGB 9.6* 09/30/2012   HCT 29.5* 09/30/2012   MCV 93.4 09/30/2012   PLT 155 09/30/2012   Basic Metabolic Panel  Recent Labs  10/03/12 0420 10/04/12 0450  NA 135 139  K 4.4 4.8  CL 102 105  CO2 26 29  GLUCOSE 93 102*  BUN 31* 28*  CREATININE 1.27* 1.30*  CALCIUM 9.2 9.3   Liver Function Tests Lab Results  Component Value Date   ALT 8 09/28/2012   AST 20 09/28/2012   ALKPHOS 52 09/28/2012   BILITOT 0.4 09/28/2012   Cardiac Enzymes Lab Results  Component Value Date   TROPONINI 2.45* 09/28/2012    Fasting Lipid Panel Lab Results  Component Value Date   CHOL 134 09/28/2012   HDL 34* 09/28/2012   LDLCALC 84 09/28/2012   TRIG 78 09/28/2012   CHOLHDL 3.9 09/28/2012    Thyroid Function Tests Lab Results  Component Value Date   TSH 2.606 09/27/2012    Disposition  Pt is being discharged home today in good condition.  Follow-up Plans & Appointments  Follow-up Information   Follow up with Christiane Ha  Margy Clarks, MD On 10/26/2012. (8:20 AM)    Contact information:   [facility] - Jonita Albee [phone]     Discharge Medications    Medication List    STOP taking these medications       isosorbide mononitrate 30 MG 24 hr tablet  Commonly known as:  IMDUR     warfarin 4 MG tablet  Commonly known as:  COUMADIN     warfarin 5 MG tablet  Commonly known as:  COUMADIN      TAKE these medications       albuterol 0.63 MG/3ML nebulizer solution  Commonly known as:  ACCUNEB  Take 1 ampule by nebulization every 6 (six) hours as needed for wheezing or shortness of breath.     aspirin 81 MG chewable tablet  Chew 1 tablet (81 mg total) by mouth daily.     atorvastatin 10 MG tablet  Commonly known as:  LIPITOR  Take 1 tablet (10 mg total) by mouth daily at 6 PM.     carvedilol 6.25 MG tablet  Commonly known as:  COREG  Take 1 tablet (6.25 mg total) by  mouth 2 (two) times daily with a meal.     clopidogrel 75 MG tablet  Commonly known as:  PLAVIX  Take 1 tablet (75 mg total) by mouth daily with breakfast.     lisinopril 5 MG tablet  Commonly known as:  PRINIVIL,ZESTRIL  Take 1 tablet (5 mg total) by mouth daily.     spironolactone 25 MG tablet  Commonly known as:  ALDACTONE  Take 0.5 tablets (12.5 mg total) by mouth daily.      Outstanding Labs/Studies  F/u bmet in 1 week. F/u lipids/lft's in 8 wks. F/u echo in 3 mos.  Duration of Discharge Encounter   Greater than 30 minutes including physician time.  Signed, Nicolasa Ducking NP 10/04/2012, 1:35 PM Patient seen and examined. I agree with the assessment and plan as detailed above. See also my additional thoughts below.   Please refer to my complete progress note from today. I made the decision for the patient to be discharged. I agree with all the plans above.  Willa Rough, MD, [facility] 10/04/2012 3:05 PM

## 2012-10-04 NOTE — Progress Notes (Signed)
Physical Therapy Treatment Patient Details Name: Kara Rice MRN: [id number] DOB: 01-Aug-1917 Today's Date: 10/04/2012 Time: 1352-1410 PT Time Calculation (min): 18 min  PT Assessment / Plan / Recommendation  History of Present Illness [name] with history of dementia, DVT (on chronic Coumadin), venous stasis with leg ulcers, COPD on home O2, remote colon cancer, CHF who presented to [facility] today upon emergent transfer from [facility].  Admitted for STEMI, s/p Left heart catheterization, Selective Coronary Angiography,  PTCA and stenting of the first diagonal. Attempted PTCA of the LAD.   PT Comments   Patient ambulated with rw. Making progress towards PT goals. Will continue to see as indicated, continue to rec dc to SNF   Follow Up Recommendations  SNF           Equipment Recommendations  None recommended by PT    Recommendations for Other Services    Frequency Min 3X/week   Progress towards PT Goals Progress towards PT goals: Progressing toward goals  Plan Current plan remains appropriate    Precautions / Restrictions Precautions Precautions: Fall Precaution Comments: DOE, chronic O2 use   Pertinent Vitals/Pain Pt reports 4/10 pain; NAD, VSS    Mobility  Bed Mobility Bed Mobility: Not assessed (pt OOB in recliner chair) Transfers Transfers: Sit to Stand;Stand to Sit Sit to Stand: 4: Min assist;With upper extremity assist;With armrests;From chair/3-in-1 Stand to Sit: 4: Min assist;With upper extremity assist;With armrests;To chair/3-in-1 Details for Transfer Assistance: min assist to support trunk during transitions.   Ambulation/Gait Ambulation/Gait Assistance: 4: Min guard Ambulation Distance (Feet): 410 Feet Assistive device: Rolling walker Ambulation/Gait Assistance Details: Pt unsafe with use of rw; multiple cues for proper use. Pt reports pain in RLE knee with ambulation Gait Pattern: Step-through pattern;Shuffle;Trunk flexed       PT Goals (current goals can now be found in the care plan section) Acute Rehab PT Goals Patient Stated Goal: to go home today PT Goal Formulation: With patient Time For Goal Achievement: 10/13/12 Potential to Achieve Goals: Good  Visit Information  Last PT Received On: 10/04/12 Assistance Needed: +1 History of Present Illness: [name] with history of dementia, DVT (on chronic Coumadin), venous stasis with leg ulcers, COPD on home O2, remote colon cancer, CH[facility].  Admitted for STEMI, s/p Left heart catheterization, Selective Coronary Angiography,  PTCA and stenting of the first diagonal. Attempted PTCA of the LAD.    Subjective Data  Subjective: Pt talking about being 79 y.o (she is 34).   Patient Stated Goal: to go home today   Cognition  Cognition Arousal/Alertness: Awake/alert Behavior During Therapy: WFL for tasks assessed/performed Overall Cognitive Status: History of cognitive impairments - at baseline    Balance   improving  End of Session PT - End of Session Equipment Utilized During Treatment: Oxygen Activity Tolerance: Patient limited by fatigue (limited by DOE) Patient left: in chair;with nursing/sitter in room;Other (comment) (sitting at nsg station) Nurse Communication: Mobility status   GP     Fabio Asa 10/04/2012, 3:01 PM [name], PT DPT  [phone]

## 2012-10-26 ENCOUNTER — Ambulatory Visit (INDEPENDENT_AMBULATORY_CARE_PROVIDER_SITE_OTHER): Payer: Medicare HMO | Admitting: Cardiology

## 2012-10-26 ENCOUNTER — Encounter: Payer: Self-pay | Admitting: Cardiology

## 2012-10-26 VITALS — BP 111/66 | HR 62 | Ht 62.0 in | Wt 144.0 lb

## 2012-10-26 DIAGNOSIS — I2109 ST elevation (STEMI) myocardial infarction involving other coronary artery of anterior wall: Secondary | ICD-10-CM

## 2012-10-26 DIAGNOSIS — I2510 Atherosclerotic heart disease of native coronary artery without angina pectoris: Secondary | ICD-10-CM

## 2012-10-26 DIAGNOSIS — I509 Heart failure, unspecified: Secondary | ICD-10-CM

## 2012-10-26 DIAGNOSIS — I5021 Acute systolic (congestive) heart failure: Secondary | ICD-10-CM

## 2012-10-26 DIAGNOSIS — Z79899 Other long term (current) drug therapy: Secondary | ICD-10-CM

## 2012-10-26 NOTE — Progress Notes (Signed)
Clinical Summary Ms. Kara Rice is a 79 y.o.female 1. CAD - 09/27/12 STEMI at [facility], anteroloateral. BMS to D1, unsuccesful PCI of LAD-->presumed CTO. - LVEF 30% by echo - since hospital discharge, denies any chest pain. Reports some DOE w/ walking at a faster pace or longer distances than normal, but overall feels well. - compliant w/ meds including: plavix, ASA, atorva 10, spironolactone lisinopril, coreg. Denies any significant side effects, no lightheadedness or dizziness. No orthopnea, PND, or LE edema     Past Medical History  Diagnosis Date  . Dementia   . DVT (deep venous thrombosis)     a. Prior hx of DVT/PE x 1, prev on coumadin;  b. 09/2012 Neg LE u/s @ [facility] -->Coumadin d/c'd.  . Venous stasis     a. Hx leg ulcers - followed in wound clinic.  Marland Kitchen COPD (chronic obstructive pulmonary disease)     On home oxygen  . Colon cancer     Age 19 s/p surgery.  . Chronic systolic CHF (congestive heart failure)     a. 09/2012 Echo: EF 30%, septal, apial, mid/dist ant, apical inferior HK.  Marland Kitchen CAD (coronary artery disease)     a. 09/2012 antlat STEMI: LM nl, LAD 90-95p exteding into Diag (3.0x24 Rebel BMS), remainder of LAD occluded (unsuccessful PCI->prob CTO), LCX 42m, OM1/2/3 30d, RCA nl.     Allergies  Allergen Reactions  . Codeine     Hx of nausea with pain meds with this  . Other     Per daughter - can't tolerate pain meds well due to nausea     Current Outpatient Prescriptions  Medication Sig Dispense Refill  . albuterol (ACCUNEB) 0.63 MG/3ML nebulizer solution Take 1 ampule by nebulization every 6 (six) hours as needed for wheezing or shortness of breath.      Marland Kitchen aspirin 81 MG chewable tablet Chew 1 tablet (81 mg total) by mouth daily.      Marland Kitchen atorvastatin (LIPITOR) 10 MG tablet Take 1 tablet (10 mg total) by mouth daily at 6 PM.      . carvedilol (COREG) 6.25 MG tablet Take 1 tablet (6.25 mg total) by mouth 2 (two) times daily with a meal.      . clopidogrel  (PLAVIX) 75 MG tablet Take 1 tablet (75 mg total) by mouth daily with breakfast.      . lisinopril (PRINIVIL,ZESTRIL) 5 MG tablet Take 1 tablet (5 mg total) by mouth daily.      Marland Kitchen spironolactone (ALDACTONE) 25 MG tablet Take 0.5 tablets (12.5 mg total) by mouth daily.       No current facility-administered medications for this visit.     Past Surgical History  Procedure Laterality Date  . Colon surgery    . Tonsillectomy and adenoidectomy       Allergies  Allergen Reactions  . Codeine     Hx of nausea with pain meds with this  . Other     Per daughter - can't tolerate pain meds well due to nausea      Family History  Problem Relation Age of Onset  . Other      Patient was unable to provide due to dementia     Social History Ms. Weible reports that she has never smoked. She has never used smokeless tobacco. Ms. Cockerham reports that she does not drink alcohol.   Review of Systems 12 point ROS negative other than reported in HPI  Physical Examination p 62 bp 111/62  Gen: resting comfortably, NAD HEENT: no scleral icterus, pupils equal round and reactive, no palptable cervical adenopathy CV Pulm: CTAB Abd: soft, NT, ND NABS, no hepatosplenomegaly Ext: warm, mild erythema bilateral extremities non-tender, right leg is dressed.  Skin: warm, no rash Neuro: A&Ox3, no focal deficits    Diagnostic Studies Cardiac Catheterization and Percutaneous Coronary Intervention 8.19.2014  PROCEDURAL FINDINGS  Hemodynamics:  AO 170/92 with a mean of 125 mmHg  LV 173/22 mmHg  Coronary angiography:  Coronary dominance: right  Left mainstem: Normal.  Left anterior descending (LAD): There is a 90-95% stenosis in the proximal LAD extending into a very large first diagonal branch. The continuation of the LAD after the diagonal is occluded. There were very mild left to left collaterals to the LAD. There were right to left collaterals to the septal perforators.  **The first  diagonal was successfully stented using a 3.0 x 24mm Rebel bare-metal stent.**  **Attempts to open the LAD were unsuccessful.**  Left circumflex (LCx): The left circumflex gives rise to 3 marginal branches. There is mild nonobstructive disease in the mid vessel up to 20%. The distal marginal branch has segmental disease up to 30%.  Right coronary artery (RCA): The right coronary has an anterior takeoff. It was engaged with a left Amplatz catheter. It is a dominant vessel and is normal.  Left ventriculography: Not performed  Final Conclusions:  1. Single vessel occlusive coronary disease.  2. Successful stenting of the first diagonal with a bare-metal stent. Unsuccessful PCI of the LAD.  _____________  2D Echocardiogram 8.20.2014  Study Conclusions  - Left ventricle: Septal apical mid and distal anterior wall hypokinesis as well as apical inferior wall hypokinesos The cavity size was moderately dilated. Wall thickness was normal. The estimated ejection fraction was 30%. - Atrial septum: No defect or patent foramen ovale was identified.  09/2012 Pertinent Labs: TC 134 TG 78 HDL 34 LDL 84 Cr 1.3 BUN 28 K 4.8   10/26/12 EKG: sinus rate 62, LAFB, Q waves V1/V2    Assessment and Plan  1. CAD/ICM - recent STEMI w/ stent to D1, appears to have CTO to LAD that could not be crossed after several attempts. LVEF 30% by echo immediately post-MI - denies any significant symptoms, some mild DOE w/ higher levels of exertion but overall is not limiting. - on appropriate medical therapy, given her age and overall fraility will avoid aggressive titration of these medications, her bp is low normal today. Continue at current doses, she is on a good regimen post-MI with a low LVEF. - due to age and comorbidities would not be a good ICD candidate, will hold off on repeating echo post MI - any reattempts to open that LAD CTO would also likely be more risk than benefit given her overall condition - check BMP  today   F/u 1 month  Antoine Poche, M.D., F.A.C.C.

## 2012-10-26 NOTE — Patient Instructions (Signed)
Your physician recommends that you schedule a follow-up appointment in: 1 month with Dr. Wyline Mood. This appointment will be scheduled today before you leave.  Your physician recommends that you continue on your current medications as directed. Please refer to the Current Medication list given to you today.  Your physician recommends that you return for lab work in: today for a BMET at [facility].

## 2012-11-09 ENCOUNTER — Telehealth: Payer: Self-pay | Admitting: Cardiology

## 2012-11-09 NOTE — Telephone Encounter (Signed)
Message copied by Burnice Logan on Wed Nov 09, 2012 11:16 AM ------      Message from: [place] F      Created: Tue Nov 08, 2012  2:16 PM       Please let patient know her chemistries show her kidney function is stable, it is mildly decreased overall but stable for her baseline function. ------

## 2012-11-09 NOTE — Telephone Encounter (Signed)
Called facility and informed nurse Guy Franco of patient lab results. Also faxed results and labs to facility.

## 2012-11-30 ENCOUNTER — Ambulatory Visit: Payer: Medicare HMO | Admitting: Cardiology

## 2012-12-06 ENCOUNTER — Encounter: Payer: Self-pay | Admitting: Cardiology

## 2012-12-06 ENCOUNTER — Ambulatory Visit (INDEPENDENT_AMBULATORY_CARE_PROVIDER_SITE_OTHER): Payer: Medicare HMO | Admitting: Cardiology

## 2012-12-06 VITALS — BP 120/70 | HR 68 | Ht 62.0 in | Wt 154.1 lb

## 2012-12-06 DIAGNOSIS — I2589 Other forms of chronic ischemic heart disease: Secondary | ICD-10-CM

## 2012-12-06 DIAGNOSIS — I255 Ischemic cardiomyopathy: Secondary | ICD-10-CM

## 2012-12-06 DIAGNOSIS — I25810 Atherosclerosis of coronary artery bypass graft(s) without angina pectoris: Secondary | ICD-10-CM

## 2012-12-06 MED ORDER — FUROSEMIDE 20 MG PO TABS: ORAL_TABLET | ORAL | Status: AC

## 2012-12-06 NOTE — Progress Notes (Signed)
Clinical Summary Ms. Cocker is a 79 y.o.female   1. CAD  - 09/27/12 STEMI at [facility], anteroloateral. BMS to D1, unsuccesful PCI of LAD-->presumed CTO.  - LVEF 30% by echo  - since hospital discharge, denies any chest pain. Denies any significant SOB or DOE. Occas orthopnea, + LE edema  - compliant w/ meds including: plavix, ASA, atorva 10, spironolactone lisinopril, coreg. Denies any significant side effects, no lightheadedness or dizziness. She reports a mechanical fall where she struck her forehead on the floor. Denies any lightheadedness, palpitations, presyncope, or sycnope.    Past Medical History  Diagnosis Date  . Dementia   . DVT (deep venous thrombosis)     a. Prior hx of DVT/PE x 1, prev on coumadin;  b. 09/2012 Neg LE u/s @ [facility] -->Coumadin d/c'd.  . Venous stasis     a. Hx leg ulcers - followed in wound clinic.  Marland Kitchen COPD (chronic obstructive pulmonary disease)     On home oxygen  . Colon cancer     Age 9 s/p surgery.  . Chronic systolic CHF (congestive heart failure)     a. 09/2012 Echo: EF 30%, septal, apial, mid/dist ant, apical inferior HK.  Marland Kitchen CAD (coronary artery disease)     a. 09/2012 antlat STEMI: LM nl, LAD 90-95p exteding into Diag (3.0x24 Rebel BMS), remainder of LAD occluded (unsuccessful PCI->prob CTO), LCX 32m, OM1/2/3 30d, RCA nl.     Allergies  Allergen Reactions  . Codeine     Hx of nausea with pain meds with this  . Other     Per daughter - can't tolerate pain meds well due to nausea  . Sulfa Antibiotics      Current Outpatient Prescriptions  Medication Sig Dispense Refill  . albuterol (ACCUNEB) 0.63 MG/3ML nebulizer solution Take 1 ampule by nebulization every 6 (six) hours as needed for wheezing or shortness of breath.      Marland Kitchen aspirin 81 MG chewable tablet Chew 1 tablet (81 mg total) by mouth daily.      Marland Kitchen atorvastatin (LIPITOR) 10 MG tablet Take 1 tablet (10 mg total) by mouth daily at 6 PM.      . carvedilol (COREG) 6.25 MG  tablet Take 1 tablet (6.25 mg total) by mouth 2 (two) times daily with a meal.      . clopidogrel (PLAVIX) 75 MG tablet Take 1 tablet (75 mg total) by mouth daily with breakfast.      . lisinopril (PRINIVIL,ZESTRIL) 5 MG tablet Take 1 tablet (5 mg total) by mouth daily.      Marland Kitchen spironolactone (ALDACTONE) 25 MG tablet Take 0.5 tablets (12.5 mg total) by mouth daily.       No current facility-administered medications for this visit.     Past Surgical History  Procedure Laterality Date  . Colon surgery    . Tonsillectomy and adenoidectomy       Allergies  Allergen Reactions  . Codeine     Hx of nausea with pain meds with this  . Other     Per daughter - can't tolerate pain meds well due to nausea  . Sulfa Antibiotics       Family History  Problem Relation Age of Onset  . Other      Patient was unable to provide due to dementia     Social History Ms. Baldyga reports that she has never smoked. She has never used smokeless tobacco. Ms. Cundy reports that she does  not drink alcohol.   Review of Systems CONSTITUTIONAL: No weight loss, fever, chills, weakness or fatigue.  HEENT: Eyes: No visual loss, blurred vision, double vision or yellow sclerae.No hearing loss, sneezing, congestion, runny nose or sore throat.  SKIN: No rash or itching.  CARDIOVASCULAR: per HPI RESPIRATORY: per HPI  GASTROINTESTINAL: No anorexia, nausea, vomiting or diarrhea. No abdominal pain or blood.  GENITOURINARY: No burning on urination, no polyuria NEUROLOGICAL: No headache, dizziness, syncope, paralysis, ataxia, numbness or tingling in the extremities. No change in bowel or bladder control.  MUSCULOSKELETAL: No muscle, back pain, joint pain or stiffness.  LYMPHATICS: No enlarged nodes. No history of splenectomy.  PSYCHIATRIC: No history of depression or anxiety.  ENDOCRINOLOGIC: No reports of sweating, cold or heat intolerance. No polyuria or polydipsia.  Marland Kitchen   Physical Examination p 68 bp  120/70 Wt 154 lbs BMI 28 Gen: resting comfortably, no acute distress HEENT: no scleral icterus, pupils equal round and reactive, no palptable cervical adenopathy,  CV: RRR, no m/r/g, no JVD, no carotid bruits Resp: Clear to auscultation bilaterally GI: abdomen is soft, non-tender, non-distended, normal bowel sounds, no hepatosplenomegaly MSK: extremities are warm, 1+ bilateral edema Skin: warm, no rash Neuro:  no focal deficits Psych: appropriate affect   Diagnostic Studies Cardiac Catheterization and Percutaneous Coronary Intervention 8.19.2014  PROCEDURAL FINDINGS  Hemodynamics:  AO 170/92 with a mean of 125 mmHg  LV 173/22 mmHg  Coronary angiography:  Coronary dominance: right  Left mainstem: Normal.  Left anterior descending (LAD): There is a 90-95% stenosis in the proximal LAD extending into a very large first diagonal Paitlyn Mcclatchey. The continuation of the LAD after the diagonal is occluded. There were very mild left to left collaterals to the LAD. There were right to left collaterals to the septal perforators.  **The first diagonal was successfully stented using a 3.0 x 24mm Rebel bare-metal stent.**  **Attempts to open the LAD were unsuccessful.**  Left circumflex (LCx): The left circumflex gives rise to 3 marginal branches. There is mild nonobstructive disease in the mid vessel up to 20%. The distal marginal Aysia Lowder has segmental disease up to 30%.  Right coronary artery (RCA): The right coronary has an anterior takeoff. It was engaged with a left Amplatz catheter. It is a dominant vessel and is normal.  Left ventriculography: Not performed  Final Conclusions:  1. Single vessel occlusive coronary disease.  2. Successful stenting of the first diagonal with a bare-metal stent. Unsuccessful PCI of the LAD.  _____________  2D Echocardiogram 8.20.2014  Study Conclusions  - Left ventricle: Septal apical mid and distal anterior wall hypokinesis as well as apical inferior wall  hypokinesos The cavity size was moderately dilated. Wall thickness was normal. The estimated ejection fraction was 30%. - Atrial septum: No defect or patent foramen ovale was identified.  09/2012 Pertinent Labs: TC 134 TG 78 HDL 34 LDL 84 Cr 1.3 BUN 28 K 4.8  11/04/12: Na 138 K 4.5 Cr 1.47 BUN 30 GFR 35   10/26/12 EKG: sinus rate 62, LAFB, Q waves V1/V2        Assessment and Plan  1. CAD/ICM  - recent STEMI w/ stent to D1, appears to have CTO to LAD that could not be crossed after several attempts. LVEF 30% by echo immediately post-MI  - denies any significant symptoms, some mild DOE w/ higher levels of exertion but overall is not limiting.  - on appropriate medical therapy, given her age and overall fraility will avoid aggressive titration of these  medications, her bp is low normal today. Continue at current doses, she is on a good regimen post-MI with a low LVEF.  - due to age and comorbidities would not be a good ICD candidate, will hold off on repeating echo post MI  - any reattempts to open that LAD CTO would also likely be more risk than benefit given her overall condition  - evidence of mild volume overload supported by exam and symptoms, will start her on lasix 20mg  every Mon, Wed, Fri.           Antoine Poche, M.D., F.A.C.C.

## 2012-12-06 NOTE — Patient Instructions (Signed)
Your physician recommends that you schedule a follow-up appointment in: 3 months with Dr. Wyline Mood. This appointment will be scheduled today before you leave.  Your physician has recommended you make the following change in your medication:  Start: Furosemide (Lasix) 20 MG 1 tablet by mouth on Monday, Wednesday, and Friday.  Continue all other medications the same.

## 2013-03-01 ENCOUNTER — Encounter: Payer: Medicare HMO | Admitting: Cardiology

## 2013-03-01 NOTE — Progress Notes (Signed)
    ERROR Cancelled  

## 2013-03-14 ENCOUNTER — Ambulatory Visit (INDEPENDENT_AMBULATORY_CARE_PROVIDER_SITE_OTHER): Payer: Medicare HMO | Admitting: Cardiology

## 2013-03-14 ENCOUNTER — Encounter: Payer: Self-pay | Admitting: Cardiology

## 2013-03-14 ENCOUNTER — Encounter: Payer: Medicare HMO | Admitting: Cardiology

## 2013-03-14 VITALS — BP 90/58 | HR 54 | Ht 62.0 in | Wt 149.8 lb

## 2013-03-14 DIAGNOSIS — I2589 Other forms of chronic ischemic heart disease: Secondary | ICD-10-CM

## 2013-03-14 DIAGNOSIS — I255 Ischemic cardiomyopathy: Secondary | ICD-10-CM

## 2013-03-14 DIAGNOSIS — I2510 Atherosclerotic heart disease of native coronary artery without angina pectoris: Secondary | ICD-10-CM

## 2013-03-14 MED ORDER — CARVEDILOL 3.125 MG PO TABS: 3.1250 mg | ORAL_TABLET | Freq: Two times a day (BID) | ORAL | Status: AC

## 2013-03-14 NOTE — Progress Notes (Signed)
Clinical Summary Ms. Hoffmeier is a 80 y.o.female seen today for follow up of the following medical problems.   1. CAD  - 09/27/12 STEMI at [facility], anteroloateral. BMS to D1, unsuccesful PCI of LAD-->presumed CTO.  - LVEF 30% by echo  -  denies any chest pain. Denies any significant SOB or DOE. Occas orthopnea, + LE edema  - compliant w/ meds including: plavix, ASA, atorva 10, spironolactone lisinopril, coreg. Denies any significant side effects, no lightheadedness or dizziness.    Past Medical History  Diagnosis Date  . Dementia   . DVT (deep venous thrombosis)     a. Prior hx of DVT/PE x 1, prev on coumadin;  b. 09/2012 Neg LE u/s @ [facility] -->Coumadin d/c'd.  . Venous stasis     a. Hx leg ulcers - followed in wound clinic.  Marland Kitchen COPD (chronic obstructive pulmonary disease)     On home oxygen  . Colon cancer     Age 61 s/p surgery.  . Chronic systolic CHF (congestive heart failure)     a. 09/2012 Echo: EF 30%, septal, apial, mid/dist ant, apical inferior HK.  Marland Kitchen CAD (coronary artery disease)     a. 09/2012 antlat STEMI: LM nl, LAD 90-95p exteding into Diag (3.0x24 Rebel BMS), remainder of LAD occluded (unsuccessful PCI->prob CTO), LCX 18m, OM1/2/3 30d, RCA nl.     Allergies  Allergen Reactions  . Codeine     Hx of nausea with pain meds with this  . Guaifenesin & Derivatives   . Morphine And Related   . Other     Per daughter - can't tolerate pain meds well due to nausea  . Sulfa Antibiotics      Current Outpatient Prescriptions  Medication Sig Dispense Refill  . albuterol (ACCUNEB) 0.63 MG/3ML nebulizer solution Take 1 ampule by nebulization every 6 (six) hours as needed for wheezing or shortness of breath.      Marland Kitchen aspirin 81 MG chewable tablet Chew 1 tablet (81 mg total) by mouth daily.      Marland Kitchen atorvastatin (LIPITOR) 10 MG tablet Take 1 tablet (10 mg total) by mouth daily at 6 PM.      . carvedilol (COREG) 6.25 MG tablet Take 1 tablet (6.25 mg total) by mouth 2  (two) times daily with a meal.      . clopidogrel (PLAVIX) 75 MG tablet Take 1 tablet (75 mg total) by mouth daily with breakfast.      . doxycycline (DORYX) 100 MG DR capsule Take 100 mg by mouth 2 (two) times daily.      . furosemide (LASIX) 20 MG tablet Take 1 tablet on Monday, Wednesday, and Friday  12 tablet  6  . lisinopril (PRINIVIL,ZESTRIL) 5 MG tablet Take 1 tablet (5 mg total) by mouth daily.      Marland Kitchen spironolactone (ALDACTONE) 25 MG tablet Take 0.5 tablets (12.5 mg total) by mouth daily.      . traZODone (DESYREL) 50 MG tablet Take 50 mg by mouth at bedtime.       No current facility-administered medications for this visit.     Past Surgical History  Procedure Laterality Date  . Colon surgery    . Tonsillectomy and adenoidectomy       Allergies  Allergen Reactions  . Codeine     Hx of nausea with pain meds with this  . Guaifenesin & Derivatives   . Morphine And Related   . Other  Per daughter - can't tolerate pain meds well due to nausea  . Sulfa Antibiotics       Family History  Problem Relation Age of Onset  . Other      Patient was unable to provide due to dementia     Social History Ms. Mcclusky reports that she has never smoked. She has never used smokeless tobacco. Ms. Arnell reports that she does not drink alcohol.   Review of Systems CONSTITUTIONAL: No weight loss, fever, chills, weakness or fatigue.  HEENT: Eyes: No visual loss, blurred vision, double vision or yellow sclerae.No hearing loss, sneezing, congestion, runny nose or sore throat.  SKIN: No rash or itching.  CARDIOVASCULAR: per HPI RESPIRATORY: No shortness of breath, cough or sputum.  GASTROINTESTINAL: No anorexia, nausea, vomiting or diarrhea. No abdominal pain or blood.  GENITOURINARY: No burning on urination, no polyuria NEUROLOGICAL: No headache, dizziness, syncope, paralysis, ataxia, numbness or tingling in the extremities. No change in bowel or bladder control.    MUSCULOSKELETAL: No muscle, back pain, joint pain or stiffness.  LYMPHATICS: No enlarged nodes. No history of splenectomy.  PSYCHIATRIC: No history of depression or anxiety.  ENDOCRINOLOGIC: No reports of sweating, cold or heat intolerance. No polyuria or polydipsia.  Marland Kitchen   Physical Examination p 54 bp 90/58 Wt 149 lbs BMI 27 Gen: resting comfortably, no acute distress HEENT: no scleral icterus, pupils equal round and reactive, no palptable cervical adenopathy,  CV: RRR, no m/r/g, no JVD, no carotid bruits Resp: Clear to auscultation bilaterally GI: abdomen is soft, non-tender, non-distended, normal bowel sounds, no hepatosplenomegaly MSK: extremities are warm, no edema.  Skin: warm, no rash Neuro:  no focal deficits Psych: appropriate affect   Diagnostic Studies Cardiac Catheterization and Percutaneous Coronary Intervention 8.19.2014  PROCEDURAL FINDINGS  Hemodynamics:  AO 170/92 with a mean of 125 mmHg  LV 173/22 mmHg  Coronary angiography:  Coronary dominance: right  Left mainstem: Normal.  Left anterior descending (LAD): There is a 90-95% stenosis in the proximal LAD extending into a very large first diagonal Costantino Kohlbeck. The continuation of the LAD after the diagonal is occluded. There were very mild left to left collaterals to the LAD. There were right to left collaterals to the septal perforators.  **The first diagonal was successfully stented using a 3.0 x 1mm Rebel bare-metal stent.**  **Attempts to open the LAD were unsuccessful.**  Left circumflex (LCx): The left circumflex gives rise to 3 marginal branches. There is mild nonobstructive disease in the mid vessel up to 20%. The distal marginal Sabiha Sura has segmental disease up to 30%.  Right coronary artery (RCA): The right coronary has an anterior takeoff. It was engaged with a left Amplatz catheter. It is a dominant vessel and is normal.  Left ventriculography: Not performed  Final Conclusions:  1. Single vessel occlusive  coronary disease.  2. Successful stenting of the first diagonal with a bare-metal stent. Unsuccessful PCI of the LAD.  _____________  2D Echocardiogram 8.20.2014  Study Conclusions  - Left ventricle: Septal apical mid and distal anterior wall hypokinesis as well as apical inferior wall hypokinesos The cavity size was moderately dilated. Wall thickness was normal. The estimated ejection fraction was 30%. - Atrial septum: No defect or patent foramen ovale was identified.  09/2012 Pertinent Labs: TC 134 TG 78 HDL 34 LDL 84 Cr 1.3 BUN 28 K 4.8  11/04/12: Na 138 K 4.5 Cr 1.47 BUN 30 GFR 35  10/26/12 EKG: sinus rate 62, LAFB, Q waves V1/V2  Assessment and Plan  1. CAD/ICM  - recent STEMI w/ stent to D1, appears to have CTO to LAD that could not be crossed after several attempts. LVEF 30% by echo immediately post-MI  - denies any significant symptoms - on appropriate medical therapy, given her age and overall fraility will avoid aggressive titration of these medications - her pulse and blood pressure are actually on the low side today, will decrease her coreg to 3.125mg  bid.  - due to age and comorbidities would not be a good ICD candidate, will hold off on repeating echo post MI  - any reattempts to open that LAD CTO would also likely be more risk than benefit given her overall condition         Arnoldo Lenis, M.D., F.A.C.C.

## 2013-03-14 NOTE — Patient Instructions (Signed)
Your physician recommends that you schedule a follow-up appointment in: 6 months. You will receive a reminder letter in the mail in about 4 months reminding you to call and schedule your appointment. If you don't receive this letter, please contact our office. Your physician has recommended you make the following change in your medication: Decrease carvedilol to 3.125 mg twice daily. All other medications will remain the same.

## 2013-03-14 NOTE — Progress Notes (Signed)
    ERROR Cancelled Appt 

## 2013-06-09 DEATH — deceased

## 2014-01-18 ENCOUNTER — Encounter (HOSPITAL_COMMUNITY): Payer: Self-pay | Admitting: Cardiology

## 2015-04-15 IMAGING — CR DG CHEST 2V
2 series · 2 of 2 positions shown · non-contrast
Comparison: 09/27/2012

CLINICAL DATA: Shortness of breath

CHEST - 2 VIEW

[w chest lat]
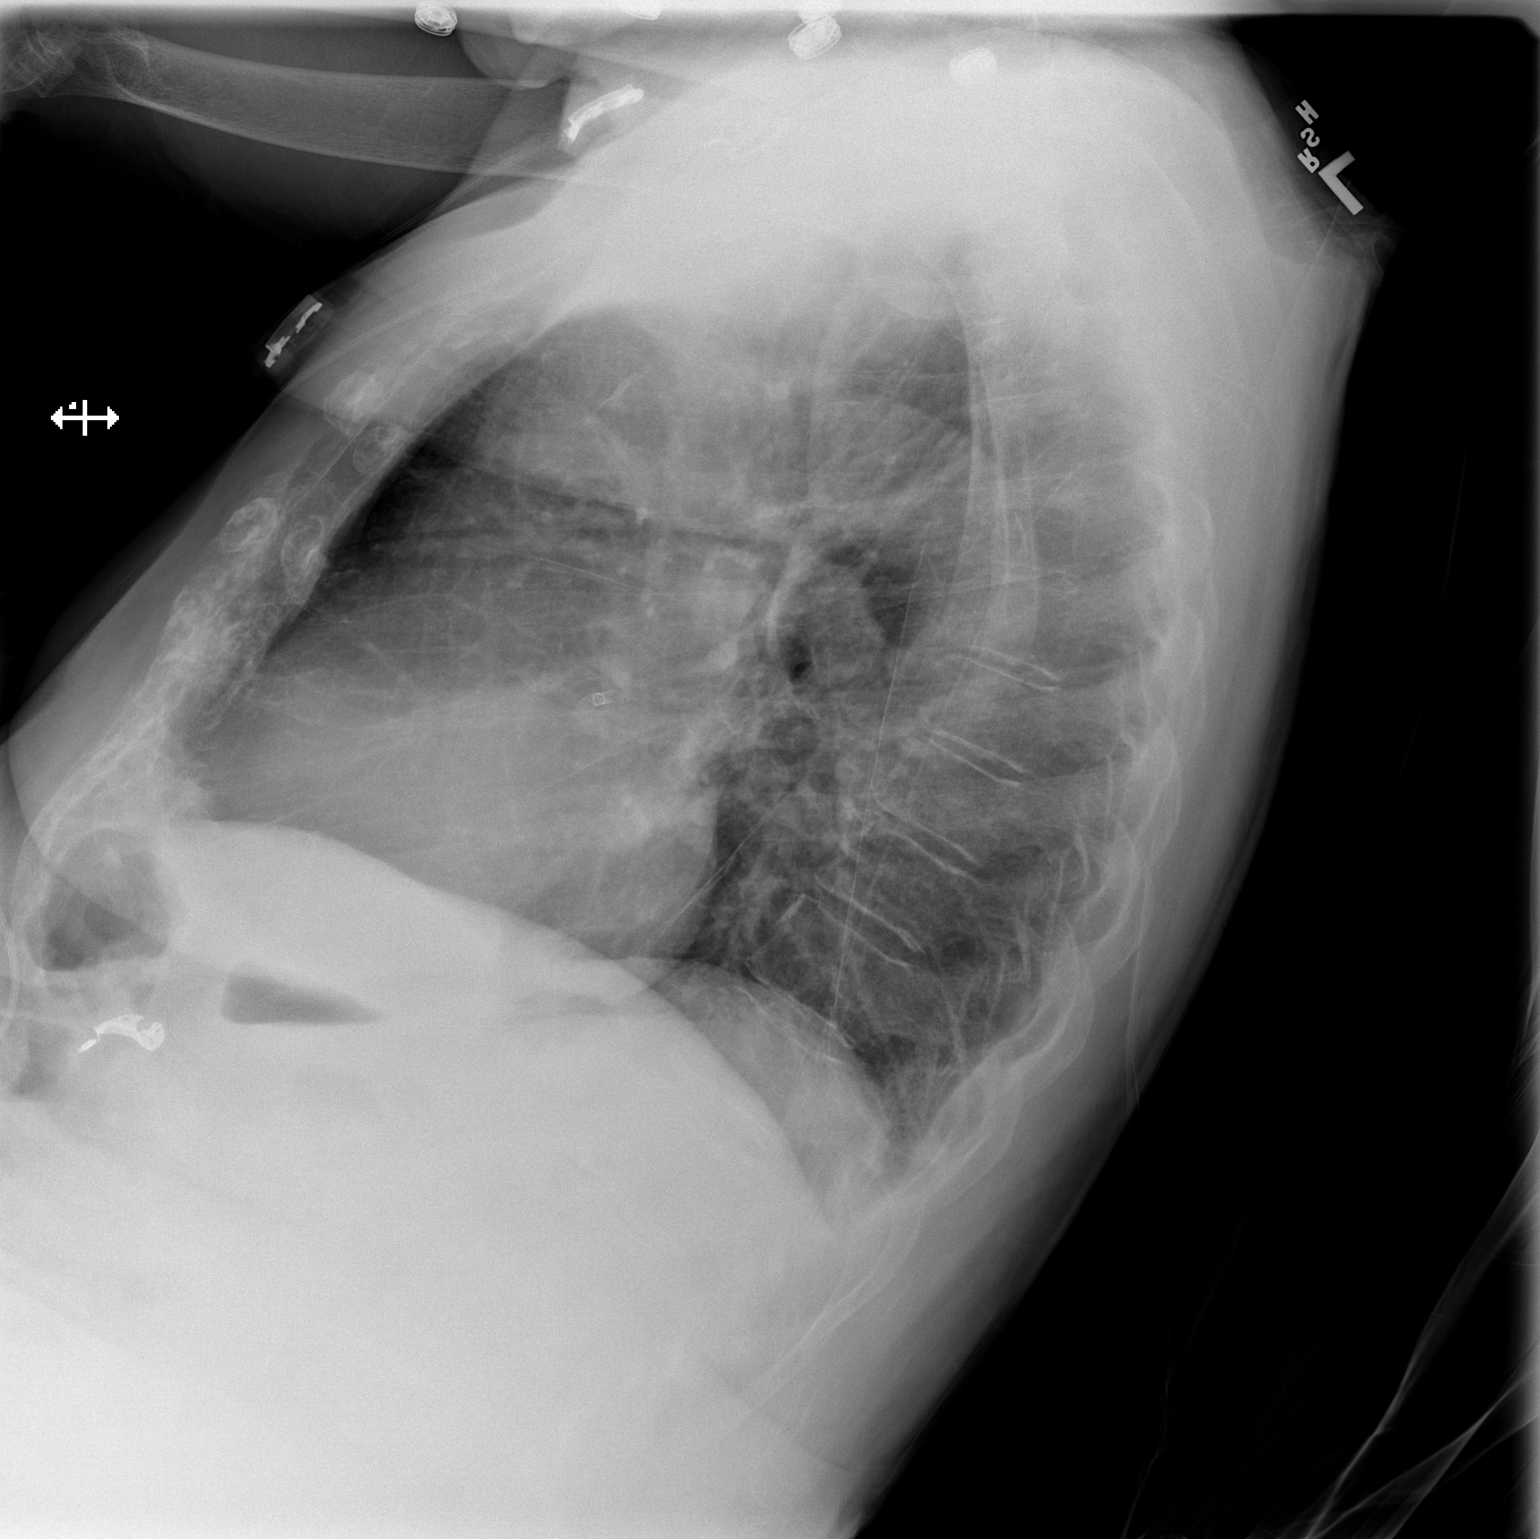

[x chest ap]
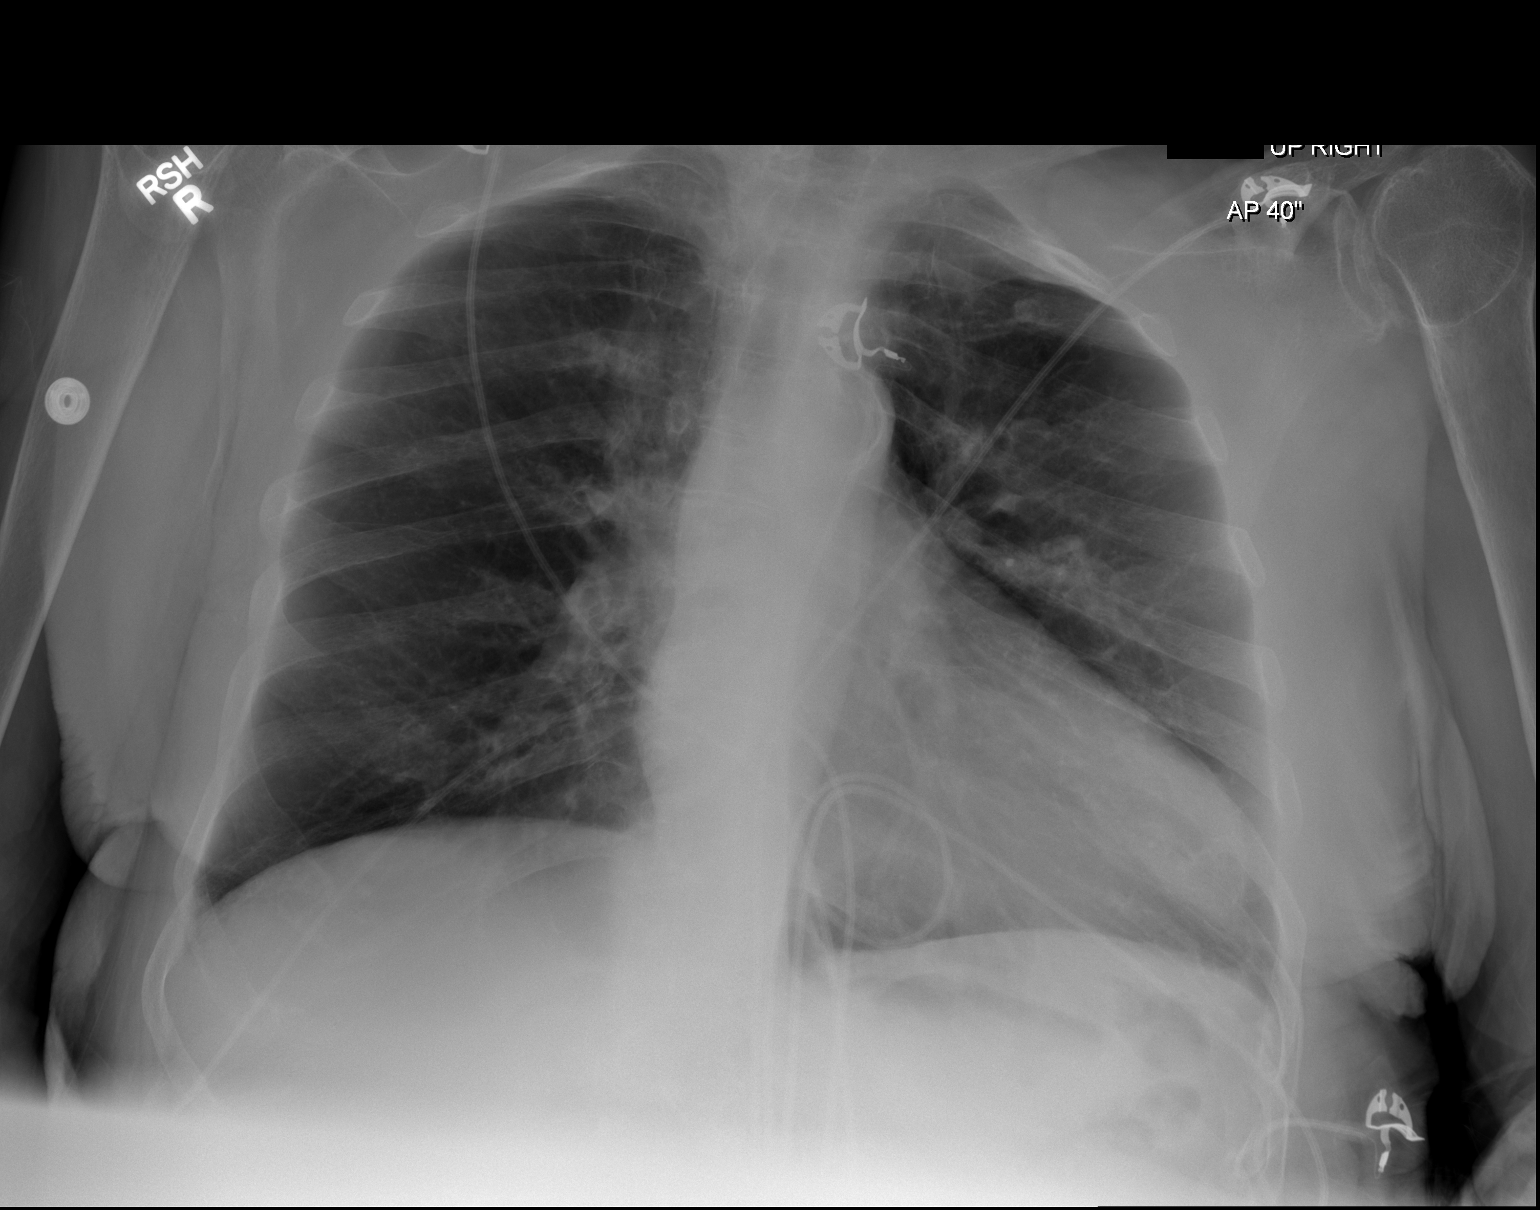

[2 of 2 positions shown; findings below may reference images not displayed]

FINDINGS: The cardiac shadow is stable.  The lungs are clear
bilaterally.  No focal infiltrate is seen.
IMPRESSION: No acute abnormality noted.

## 2015-04-18 IMAGING — CR DG CHEST 2V
1 series · 1 of 1 positions shown · non-contrast
Comparison: 09/30/2012

CLINICAL DATA: CHF, cough

CHEST - 2 VIEW

[w chest lat]
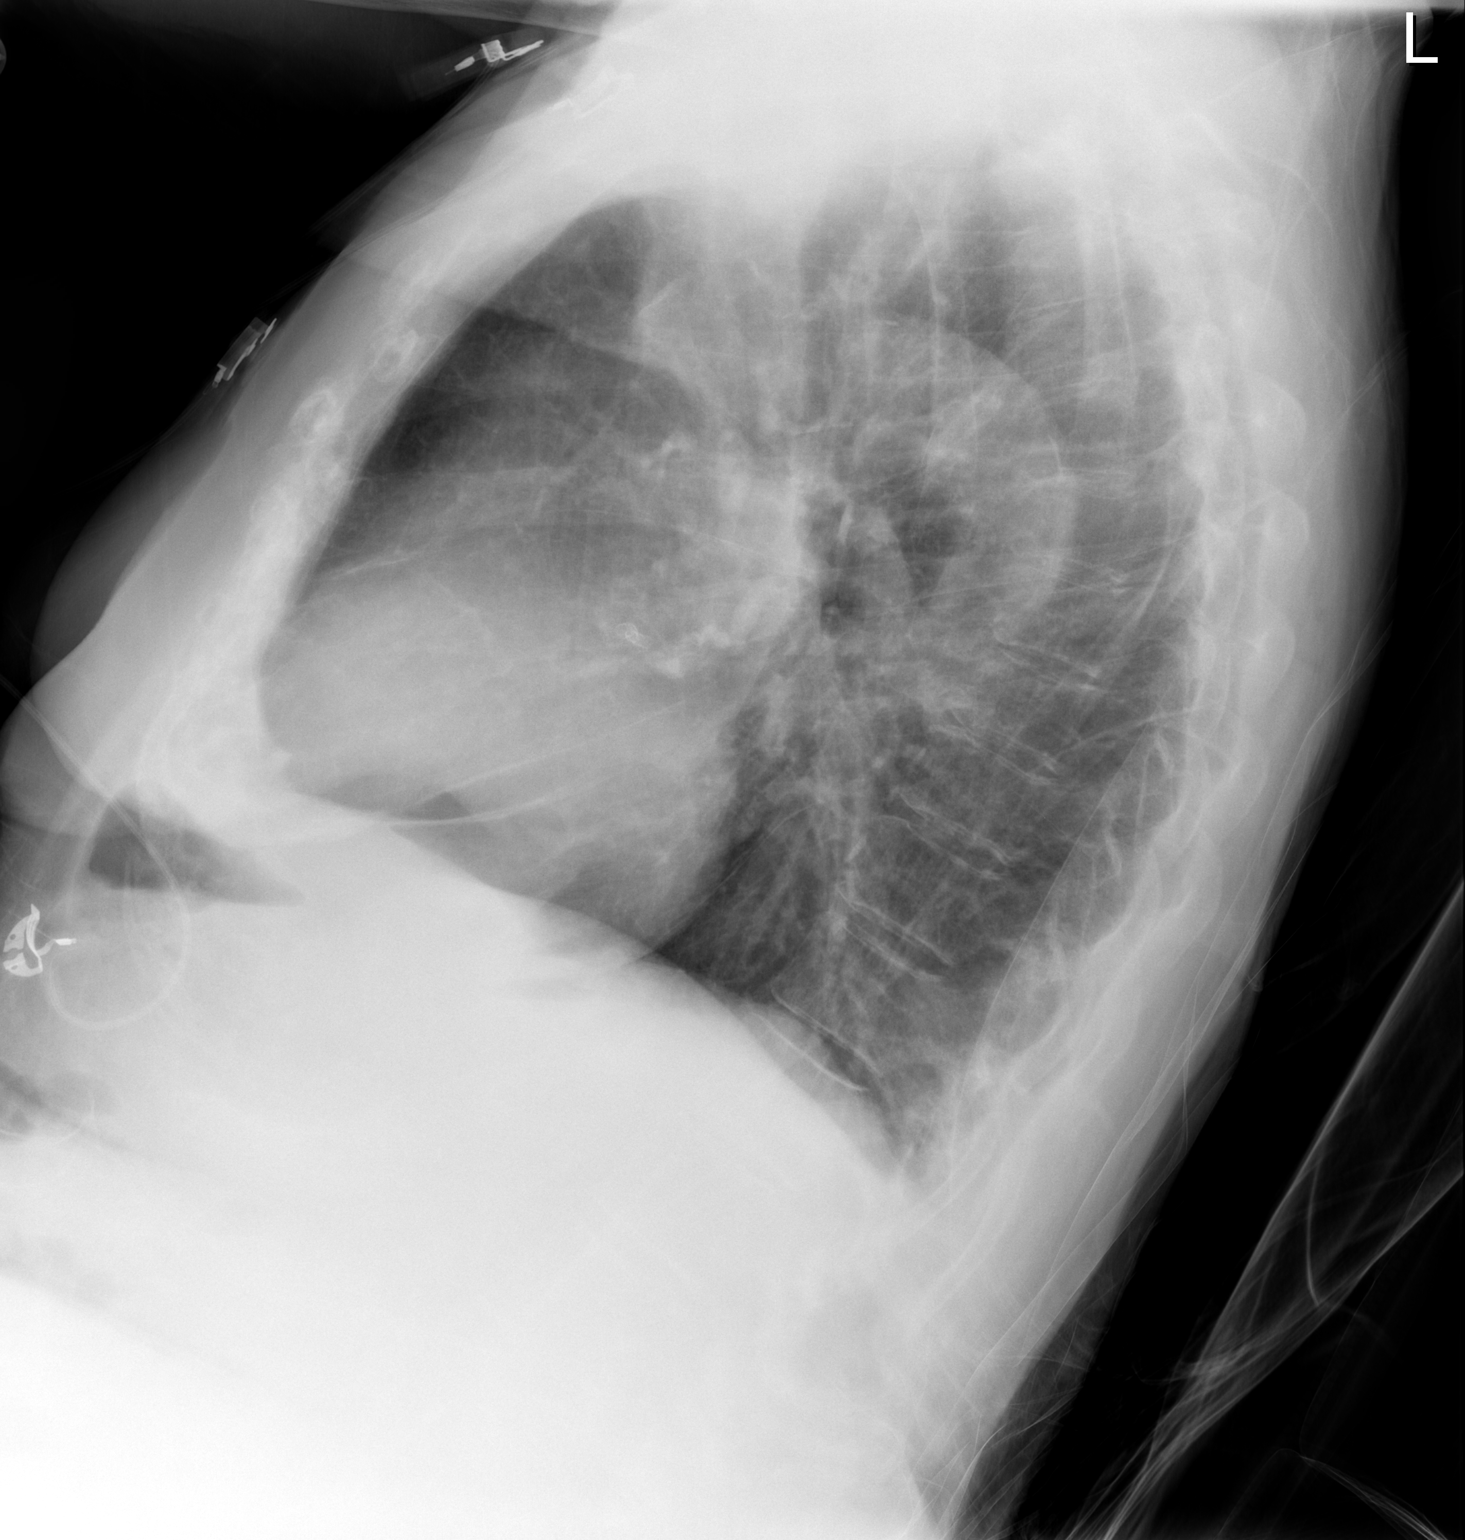

[1 of 1 positions shown; findings below may reference images not displayed]

FINDINGS: The cardiac shadow is stable.  The lungs are well-aerated
bilaterally without focal infiltrate or sizable effusion.  No bony
abnormality is noted.
IMPRESSION: No acute abnormalities seen.  No change from prior exam.
